# Patient Record
Sex: Female | Born: 1975 | Race: Black or African American | Hispanic: No | Marital: Married | Smoking: Never smoker
Health system: Southern US, Community
[De-identification: ages and names within clinical notes are randomized; demographics above are authoritative.]

## PROBLEM LIST (undated history)

## (undated) DIAGNOSIS — C801 Malignant (primary) neoplasm, unspecified: Secondary | ICD-10-CM

## (undated) DIAGNOSIS — D649 Anemia, unspecified: Secondary | ICD-10-CM

## (undated) DIAGNOSIS — E119 Type 2 diabetes mellitus without complications: Secondary | ICD-10-CM

## (undated) HISTORY — PX: ANKLE SURGERY: SHX546

## (undated) HISTORY — PX: PORTACATH PLACEMENT: SHX2246

## (undated) HISTORY — DX: Anemia, unspecified: D64.9

## (undated) HISTORY — DX: Type 2 diabetes mellitus without complications: E11.9

---

## 1998-06-12 ENCOUNTER — Ambulatory Visit (HOSPITAL_COMMUNITY): Admission: RE | Admit: 1998-06-12 | Discharge: 1998-06-12 | Payer: Self-pay | Admitting: Obstetrics

## 1998-07-10 ENCOUNTER — Inpatient Hospital Stay (HOSPITAL_COMMUNITY): Admission: AD | Admit: 1998-07-10 | Discharge: 1998-07-11 | Payer: Self-pay | Admitting: *Deleted

## 1998-08-13 ENCOUNTER — Emergency Department (HOSPITAL_COMMUNITY): Admission: EM | Admit: 1998-08-13 | Discharge: 1998-08-13 | Payer: Self-pay | Admitting: Internal Medicine

## 1998-08-13 ENCOUNTER — Encounter: Payer: Self-pay | Admitting: Emergency Medicine

## 1999-12-17 ENCOUNTER — Other Ambulatory Visit: Admission: RE | Admit: 1999-12-17 | Discharge: 1999-12-17 | Payer: Self-pay | Admitting: Obstetrics

## 1999-12-17 ENCOUNTER — Encounter (INDEPENDENT_AMBULATORY_CARE_PROVIDER_SITE_OTHER): Payer: Self-pay

## 2000-05-02 ENCOUNTER — Encounter (INDEPENDENT_AMBULATORY_CARE_PROVIDER_SITE_OTHER): Payer: Self-pay | Admitting: Specialist

## 2000-05-02 ENCOUNTER — Ambulatory Visit (HOSPITAL_COMMUNITY): Admission: RE | Admit: 2000-05-02 | Discharge: 2000-05-02 | Payer: Self-pay | Admitting: *Deleted

## 2001-12-04 ENCOUNTER — Encounter: Admission: RE | Admit: 2001-12-04 | Discharge: 2001-12-04 | Payer: Self-pay | Admitting: *Deleted

## 2001-12-04 ENCOUNTER — Other Ambulatory Visit: Admission: RE | Admit: 2001-12-04 | Discharge: 2001-12-04 | Payer: Self-pay | Admitting: *Deleted

## 2002-01-01 ENCOUNTER — Encounter: Admission: RE | Admit: 2002-01-01 | Discharge: 2002-01-01 | Payer: Self-pay | Admitting: *Deleted

## 2004-08-30 ENCOUNTER — Emergency Department (HOSPITAL_COMMUNITY): Admission: EM | Admit: 2004-08-30 | Discharge: 2004-08-30 | Payer: Self-pay | Admitting: Emergency Medicine

## 2004-10-29 ENCOUNTER — Encounter: Admission: RE | Admit: 2004-10-29 | Discharge: 2004-10-29 | Payer: Self-pay | Admitting: Obstetrics and Gynecology

## 2007-02-27 ENCOUNTER — Emergency Department (HOSPITAL_COMMUNITY): Admission: EM | Admit: 2007-02-27 | Discharge: 2007-02-27 | Payer: Self-pay | Admitting: Emergency Medicine

## 2010-08-24 NOTE — H&P (Signed)
[facility]  Patient:    Rita Frazier, Rita Frazier                     MRN: [id number] Attending:  Radene Knee., M.D.                         History and Physical  DATE OF BIRTH:  Apr 08, 1976  REASON FOR ADMISSION:  This 35 year old female is scheduled at [facility] on May 02, 2000, for marsupialization of a periurethral cyst, which is infected, and cystoscopy.  HISTORY OF PRESENT ILLNESS:  This 35 year old female was seen initially in March of 2000 when she was 8 months pregnant with a mass just to the left of the urethral meatus.  This was aspirated of purulent material.  She was treated with antibiotics and advised to have this excised when her pregnancy was completed.  The patient did not return until April 23, 2000.  She reports that she had not had any pain, but had been seen by her medical doctor in the clinic, who referred her here because of recurrence of the left periurethral mass.  No pain.  No urinary symptoms were noted.  No passage of blood, gravel, or stone.  We again aspirated the cyst of some 8-10 cc of purulent material.  Cultures were negative.  Gentamicin was injected into the cyst.  The patient was placed on Cipro 500 mg b.i.d.  She returns to the office on April 30, 2000, and the cyst has completely refilled itself and displaces the urethral meatus to the right.  She was advised to have marsupialization and cystoscopy and advised that complete excision of the cyst might cause some difficulty with urinary control.  She is agreeable to this procedure.  ALLERGIES:  None.  PRESENT MEDICATIONS:  Cipro 500 mg b.i.d.  Denies the use of aspirin or other medications.  PAST MEDICAL HISTORY:  She has had no surgeries and no serious illnesses, except for surgery on her left foot in 1998.  REVIEW OF SYSTEMS:  Her general health has been good.  Weight stable.  HEENT: Unremarkable.  Cardiorespiratory:  No chest pain or heart  attack.  GI:  No peptic ulcer disease.  No hepatitis.  Bowels moved normally.  Bones, Joints, and Muscles:  No arthritis.  Neuropsychiatric:  No stroke, fainting, or falling down spells.  OB/GYN:  She has had two pregnancies.  She has two children.  Last menstrual period about three to four weeks ago and have been normal.  FAMILY HISTORY:  To her knowledge, there are no stones, diabetes, heart disease, or bleeders that run in the family.  Father living and well in his early 40s.  Mother in her mid 64s living and well.  She has four sisters in good health.  Two children.  SOCIAL HISTORY:  A single female.  She does not abuse tobacco or alcohol.  PHYSICAL EXAMINATION:  A well-developed, well-nourished, 35 year old female.  VITAL SIGNS:  Blood pressure 110/80, temperature 98 degrees, pulse 76, respirations 16.  HEENT:  The ears and tympanic membranes are unremarkable.  Eyes react normally to light and accommodation.  Extraocular movements intact.  Pharynx benign. Teeth in good repair.  NECK:  No enlargement of thyroid.  No nodes palpable.  CHEST:  Clear to auscultation and auscultation.  HEART:  Normal sinus rhythm.  No murmur.  Not enlarged.  BREASTS:  Not examined.  ABDOMEN:  Moderately obese.  No masses  or tenderness.  The ultrasound revealed that her hydronephrosis of pregnancy had resolved.  She had less than 1 ounce of residual urine.  PELVIC:  A soft, nontender mass just to the left of the urethral meatus, extending backwards several centimeters.  It was fluctuant.  RECTAL:  Tone is good.  No masses are noted.  The uterus is large, anterior, and nontender.  EXTREMITIES:  No edema.  Good peripheral pulses.  NEUROLOGIC:  Grossly normal reflexes and sensation.  IMPRESSION: 1. Left periurethral cyst, infected, recurrent. 2. Hydronephrosis of pregnancy, cleared.  PLAN:  Marsupialization of periurethral cyst and cystoscopy. DD:  04/30/00 TD:  04/30/00 Job:  [id number]

## 2010-08-24 NOTE — Op Note (Signed)
[facility]  Patient:    Rita Frazier, Rita Frazier                      MRN: [id number] Proc. Date: 05/02/00 Adm. Date:  [id number] Attending:  Dalbert Mayotte                           Operative Report  PREOPERATIVE DIAGNOSIS:  Periurethral cyst, infected.  POSTOPERATIVE DIAGNOSIS:  Periurethral cyst, infected.  OPERATION PERFORMED:  Cystoscopy and marsupplination of a buried urethral cyst.  DESCRIPTION OF PROCEDURE:  This 35 year old female brought to the operating room underwent successful induction of general anesthesia and is prepped and draped in the lithotomy position. She received Cipro orally and gentamycin IV in preparation for this procedure. The patient had a large cyst just to the left of the urethra and approximately 6 or 7 cc of thick purulent material was aspirated with an 18 gauge needle and sent for cultures both aerobic and anaerobic. Then 5 cc of indigo carmine was injected into the cyst. Cystoscopy was then carried out with a 22 cystourethroscope using 70 and 12 degree lenses to check for any connection with the urethra or bladder. No connection with the cyst could be seen. The right and left ureteral orifices were normal. There was no stone, tumor, nor ulcer of the bladder. There was only mild hyperemia and erythema of the bladder and urethra. The patients bladder was then drained with a #18 Foley catheter. The cyst was then sutured in four quadrants with 2-0 Prolene traction sutures. Using the traction sutures to expose the wall of the cyst, a large window was incised in the cyst. The edges of this window were oversewn with interrupted figure-of-eight hemostatic sutures of 2-0 chromic. Good hemostasis was obtained in this manner. The inside of the cyst was examined. No connections or ramifications were noted. The cyst was then packed with iodoform gauze. The patient was sent to the recovery room in stable condition.  PLAN:  Remove  the Foley catheter if there is no bleeding and she is to go home on Cipro 500 b.i.d. Will give her Vicodin for pain and then have her return to the office in 3 or 4 days for removal of the packing. She was instructed not to engage in intercourse and she is instructed to notify us for chills, fever, vomiting, or any excess bleeding. DD:  05/02/00 TD:  05/02/00 Job: [id number]

## 2012-03-29 ENCOUNTER — Emergency Department (HOSPITAL_COMMUNITY): Payer: 59

## 2012-03-29 ENCOUNTER — Encounter (HOSPITAL_COMMUNITY): Payer: Self-pay | Admitting: Emergency Medicine

## 2012-03-29 ENCOUNTER — Emergency Department (HOSPITAL_COMMUNITY)
Admission: EM | Admit: 2012-03-29 | Discharge: 2012-03-29 | Disposition: A | Discharge status: Home / Self Care | Payer: 59 | Attending: Emergency Medicine | Admitting: Emergency Medicine

## 2012-03-29 DIAGNOSIS — R0789 Other chest pain: Secondary | ICD-10-CM | POA: Insufficient documentation

## 2012-03-29 DIAGNOSIS — M549 Dorsalgia, unspecified: Secondary | ICD-10-CM

## 2012-03-29 DIAGNOSIS — R05 Cough: Secondary | ICD-10-CM | POA: Insufficient documentation

## 2012-03-29 DIAGNOSIS — R6889 Other general symptoms and signs: Secondary | ICD-10-CM | POA: Insufficient documentation

## 2012-03-29 DIAGNOSIS — M545 Low back pain, unspecified: Secondary | ICD-10-CM | POA: Insufficient documentation

## 2012-03-29 DIAGNOSIS — R059 Cough, unspecified: Secondary | ICD-10-CM | POA: Insufficient documentation

## 2012-03-29 MED ORDER — HYDROCODONE-ACETAMINOPHEN 7.5-500 MG/15ML PO SOLN
10.0000 mL | Freq: Once | ORAL | Status: AC
  Administered 2012-03-29: 10 mL via ORAL
  Filled 2012-03-29: qty 15

## 2012-03-29 MED ORDER — HYDROCODONE-ACETAMINOPHEN 7.5-500 MG/15ML PO SOLN: 15.0000 mL | Freq: Four times a day (QID) | ORAL | Status: DC | PRN

## 2012-03-29 MED ORDER — GUAIFENESIN 100 MG/5ML PO LIQD: 100.0000 mg | ORAL | Status: DC | PRN

## 2012-03-29 NOTE — ED Notes (Signed)
Pt presenting to ed with c/o cough with greenish colored sputum pt states onset x 2 days. Pt states positive chest pain with cough. Pt states she is also having back pain since last night. Pt denies nausea and vomiting at this time. Pt denies chest pain at this time pt states only with cough

## 2012-03-29 NOTE — ED Provider Notes (Signed)
History     CSN: [id number]  Arrival date & time 03/29/12  1402   First MD Initiated Contact with Patient 03/29/12 1556      Chief Complaint  Patient presents with  . Cough  . Back Pain    (Consider location/radiation/quality/duration/timing/severity/associated sxs/prior treatment) HPI  36 year old female presents complaining of cough and back pain.  patient reports for the past 2 days she has gradual onset of persistent cough with greenish yellow sputum. She also endorsed pleuritic chest pain, runny nose, and now pain. States the pain the back is sharp, and achy, worsening with movement and cough, persistent, moderate in severity, and nonradiating. No urinary or bowel incontinence. No hematuria. She denies fever, chills, headache, ear pain, or rash. No nausea vomiting or diarrhea. Patient is a nonsmoker. She has only tried NyQuil with minimal relief. She does not have a significant history of back pain.  I will History reviewed. No pertinent past medical history.  Past Surgical History  Procedure Date  . Ankle surgery     No family history on file.  History  Substance Use Topics  . Smoking status: Never Smoker   . Smokeless tobacco: Not on file  . Alcohol Use: No    OB History    Grav Para Term Preterm Abortions TAB SAB Ect Mult Living                  Review of Systems  Constitutional: Negative for fever.  HENT: Positive for sore throat and sneezing.   Respiratory: Positive for cough. Negative for shortness of breath.   Cardiovascular: Positive for chest pain.  Gastrointestinal: Negative for abdominal pain.  Skin: Negative for rash.  Neurological: Negative for numbness.  All other systems reviewed and are negative.    Allergies  Review of patient's allergies indicates no known allergies.  Home Medications   Current Outpatient Rx  Name  Route  Sig  Dispense  Refill  . NYQUIL D COLD/FLU PO   Oral   Take 2 capsules by mouth at bedtime as needed. For cold  symptoms.           BP 129/69  Pulse 103  Temp 98.3 F (36.8 C) (Oral)  Resp 20  SpO2 99%  LMP 02/28/2012  Physical Exam  Nursing note and vitals reviewed. Constitutional: She is oriented to person, place, and time. She appears well-developed and well-nourished. No distress.       Awake, alert, nontoxic appearance  HENT:  Head: Atraumatic.  Right Ear: External ear normal.  Left Ear: External ear normal.  Mouth/Throat: Oropharynx is clear and moist.       Mild post oropharyngeal erythema without evidence of deep tissue infection.  Uvula midline  Eyes: Conjunctivae normal are normal. Right eye exhibits no discharge. Left eye exhibits no discharge.  Neck: Neck supple.  Cardiovascular: Normal rate and regular rhythm.   Pulmonary/Chest: Effort normal. No respiratory distress. She has no wheezes. She has no rales. She exhibits no tenderness.       Shallow breathing without rales or rhonchi  Abdominal: Soft. There is no tenderness. There is no rebound.  Musculoskeletal: She exhibits no edema and no tenderness.       ROM appears intact, no obvious focal weakness.  Paralumbar tenderness without midline spine tenderness, step-off or crepitus.    Patella DTR 2+  Neurological: She is alert and oriented to person, place, and time.       Mental status and motor strength appears intact  Skin: No rash noted.  Psychiatric: She has a normal mood and affect.    ED Course  Procedures (including critical care time)  Labs Reviewed - No data to display Dg Lumbar Spine Complete  03/29/2012  *RADIOLOGY REPORT*  Clinical Data: Low back pain radiating to the right hip, no injury  LUMBAR SPINE - COMPLETE 4+ VIEW  Comparison: None.  Findings: The lumbar vertebrae are in normal alignment.  There does appear to be mild degenerative disc disease at the L5-S1 level with slight loss of disc space and sclerosis.  The remainder of intervertebral disc spaces appear normal.  No compression deformity is  seen.  The facet joints are unremarkable and no pars defect is seen.  The SI joints are normally corticated.  IMPRESSION: Normal alignment with mild degenerative disc disease at L5-S1.  No acute abnormality.   Original Report Authenticated By: Dwyane Dee, M.D.      No diagnosis found.  1. Cough 2. Back pain  MDM  Pt presents with persistent cough.  She is afebrile, VSS.  Lung exam unremarkable.  Endorse low back pain.  Xray shows no acute finding, no red flags, no urinary complaints.  Plan to d/c with lortab elixir and guaifenesin.  Referral to PCP.  Low suspicion for PE.         Fayrene Helper, PA-C 03/29/12 1636

## 2012-03-29 NOTE — ED Provider Notes (Signed)
Medical screening examination/treatment/procedure(s) were performed by non-physician practitioner and as supervising physician I was immediately available for consultation/collaboration.   Flint Melter, MD 03/29/12 2329

## 2012-06-30 ENCOUNTER — Encounter: Payer: Self-pay | Admitting: *Deleted

## 2012-06-30 ENCOUNTER — Ambulatory Visit: Payer: 59 | Admitting: Family Medicine

## 2012-06-30 VITALS — BP 128/80 | HR 76 | Temp 98.3°F | Resp 12 | Ht 69.0 in | Wt 259.0 lb

## 2012-06-30 DIAGNOSIS — R638 Other symptoms and signs concerning food and fluid intake: Secondary | ICD-10-CM

## 2012-06-30 DIAGNOSIS — R3589 Other polyuria: Secondary | ICD-10-CM

## 2012-06-30 DIAGNOSIS — R5383 Other fatigue: Secondary | ICD-10-CM

## 2012-06-30 DIAGNOSIS — R5381 Other malaise: Secondary | ICD-10-CM

## 2012-06-30 DIAGNOSIS — R358 Other polyuria: Secondary | ICD-10-CM

## 2012-06-30 DIAGNOSIS — IMO0001 Reserved for inherently not codable concepts without codable children: Secondary | ICD-10-CM

## 2012-06-30 LAB — POCT URINALYSIS DIPSTICK
Glucose, UA: 500
Nitrite, UA: NEGATIVE
Protein, UA: 30
Urobilinogen, UA: 0.2
pH, UA: 5

## 2012-06-30 LAB — POCT UA - MICROSCOPIC ONLY
Casts, Ur, LPF, POC: NEGATIVE
Mucus, UA: NEGATIVE
Yeast, UA: POSITIVE

## 2012-06-30 LAB — POCT GLYCOSYLATED HEMOGLOBIN (HGB A1C): Hemoglobin A1C: 9.6

## 2012-06-30 LAB — POCT CBC
Granulocyte percent: 59 %G (ref 37–80)
MCV: 85.6 fL (ref 80–97)
MID (cbc): 0.4 (ref 0–0.9)
POC Granulocyte: 4.7 (ref 2–6.9)
POC LYMPH PERCENT: 35.6 %L (ref 10–50)
Platelet Count, POC: 264 10*3/uL (ref 142–424)
RDW, POC: 14 %

## 2012-06-30 LAB — COMPREHENSIVE METABOLIC PANEL
AST: 22 U/L (ref 0–37)
Alkaline Phosphatase: 73 U/L (ref 39–117)
BUN: 9 mg/dL (ref 6–23)
Creat: 0.92 mg/dL (ref 0.50–1.10)
Total Bilirubin: 0.5 mg/dL (ref 0.3–1.2)

## 2012-06-30 MED ORDER — BLOOD GLUCOSE METER KIT: PACK | Status: DC

## 2012-06-30 MED ORDER — METFORMIN HCL 1000 MG PO TABS: 1000.0000 mg | ORAL_TABLET | Freq: Two times a day (BID) | ORAL | Status: DC

## 2012-06-30 NOTE — Patient Instructions (Addendum)
Read the American diabetic association website  Begin metformin one half tablet twice daily for about for 5 days, then increase to one tablet twice daily with meals  Increase exercise. Walking is probably her best exercise  Decrease carbohydrate intake (breads, pulses, potatoes, rice, etc.). The foods are better for you than the process. Increase fruits and vegetables, get rid of other snacks.  Begin taking aspirin 81 mg one daily  Keep a record of your blood sugars. Try to test her sugar before breakfast and either before supper or 2 hours after supper.

## 2012-06-30 NOTE — Progress Notes (Signed)
Subjective: Patient has been having problems with excessive fatigue for the last couple of weeks. She also has been thirsty all the time. She has no history of diabetes and no family history of diabetes. She does have a history of anemia. She also has not got a lot of regular exercise. She has had some visual changes. No cardiovascular complaints. No respiratory complaints. GI unremarkable. No dysuria, but does have frequency and nocturia  Objective: Overweight lady, pleasant alert oriented, in no acute distress. TMs normal. Throat clear. Neck supple without nodes thyromegaly. Chest clear to auscultation. Heart regular without murmurs gallops or arrhythmias. Abdomen soft without mass or tenderness. No edema.   Results for orders placed in visit on 06/30/12  POCT CBC      Result Value Range   WBC 8.0  4.6 - 10.2 K/uL   Lymph, poc 2.8  0.6 - 3.4   POC LYMPH PERCENT 35.6  10 - 50 %L   MID (cbc) 0.4  0 - 0.9   POC MID % 5.4  0 - 12 %M   POC Granulocyte 4.7  2 - 6.9   Granulocyte percent 59.0  37 - 80 %G   RBC 4.47  4.04 - 5.48 M/uL   Hemoglobin 12.0 (*) 12.2 - 16.2 g/dL   HCT, POC 16.1  09.6 - 47.9 %   MCV 85.6  80 - 97 fL   MCH, POC 26.8 (*) 27 - 31.2 pg   MCHC 31.3 (*) 31.8 - 35.4 g/dL   RDW, POC 04.5     Platelet Count, POC 264  142 - 424 K/uL   MPV 10.7  0 - 99.8 fL  GLUCOSE, POCT (MANUAL RESULT ENTRY)      Result Value Range   POC Glucose 352 (*) 70 - 99 mg/dl  POCT GLYCOSYLATED HEMOGLOBIN (HGB A1C)      Result Value Range   Hemoglobin A1C 9.6    POCT UA - MICROSCOPIC ONLY      Result Value Range   WBC, Ur, HPF, POC 2-4     RBC, urine, microscopic 2-4     Bacteria, U Microscopic trace     Mucus, UA neg     Epithelial cells, urine per micros 2-5     Crystals, Ur, HPF, POC neg     Casts, Ur, LPF, POC neg     Yeast, UA positive    POCT URINALYSIS DIPSTICK      Result Value Range   Color, UA yellow     Clarity, UA clear     Glucose, UA 500     Bilirubin, UA neg     Ketones, UA 160     Spec Grav, UA 1.015     Blood, UA trace     pH, UA 5.0     Protein, UA 30     Urobilinogen, UA 0.2     Nitrite, UA neg     Leukocytes, UA Negative     Assessment: Type 2 diabetes mellitus, new diagnosis, uncontrolled Obesity Fatigue History of anemia  Plan: Begin metformin. Return in 3 or 4 weeks. See instructions. Advised her to try to find a diabetic class at [facility].

## 2012-07-01 ENCOUNTER — Encounter: Payer: Self-pay | Admitting: Family Medicine

## 2012-07-02 ENCOUNTER — Other Ambulatory Visit: Payer: Self-pay | Admitting: Obstetrics

## 2012-07-02 ENCOUNTER — Ambulatory Visit (INDEPENDENT_AMBULATORY_CARE_PROVIDER_SITE_OTHER): Payer: Self-pay | Admitting: Obstetrics

## 2012-07-02 ENCOUNTER — Encounter: Payer: Self-pay | Admitting: Obstetrics

## 2012-07-02 VITALS — BP 138/89 | HR 110 | Temp 97.5°F | Ht 70.0 in | Wt 207.0 lb

## 2012-07-02 DIAGNOSIS — Z124 Encounter for screening for malignant neoplasm of cervix: Secondary | ICD-10-CM | POA: Insufficient documentation

## 2012-07-02 DIAGNOSIS — N760 Acute vaginitis: Secondary | ICD-10-CM | POA: Insufficient documentation

## 2012-07-02 DIAGNOSIS — Z7189 Other specified counseling: Secondary | ICD-10-CM | POA: Insufficient documentation

## 2012-07-02 DIAGNOSIS — Z01419 Encounter for gynecological examination (general) (routine) without abnormal findings: Secondary | ICD-10-CM

## 2012-07-02 DIAGNOSIS — N920 Excessive and frequent menstruation with regular cycle: Secondary | ICD-10-CM | POA: Insufficient documentation

## 2012-07-02 MED ORDER — MEDROXYPROGESTERONE ACETATE 10 MG PO TABS: 10.0000 mg | ORAL_TABLET | Freq: Every day | ORAL | Status: DC

## 2012-07-02 MED ORDER — FLUCONAZOLE 150 MG PO TABS: 150.0000 mg | ORAL_TABLET | Freq: Once | ORAL | Status: DC

## 2012-07-02 MED ORDER — METRONIDAZOLE 500 MG PO TABS: 500.0000 mg | ORAL_TABLET | Freq: Two times a day (BID) | ORAL | Status: DC

## 2012-07-02 NOTE — Progress Notes (Signed)
  Subjective:     Rita Frazier is a 37 y.o. female and is here for a comprehensive physical exam. The patient reports problems - Bready vaginal discharge and heavy and long periods..  History   Social History  . Marital Status: Married    Spouse Name: [name]    Number of Children: 2  . Years of Education: N/A   Occupational History  . Customer Service     Social History Main Topics  . Smoking status: Never Smoker   . Smokeless tobacco: Never Used  . Alcohol Use: No  . Drug Use: No  . Sexually Active: Yes   Other Topics Concern  . Not on file   Social History Narrative  . No narrative on file   Health Maintenance  Topic Date Due  . Influenza Vaccine  12/07/1977  . Tetanus/tdap  12/01/1996  . Pap Smear  05/12/2014    The following portions of the patient's history were reviewed and updated as appropriate: allergies, current medications, past family history, past medical history, past social history, past surgical history and problem list.  Review of Systems A comprehensive review of systems was negative except for: Genitourinary: positive for abnormal menstrual periods   Objective:    General appearance: alert and no distress Breasts: normal appearance, no masses or tenderness Abdomen: soft, non-tender; bowel sounds normal; no masses,  no organomegaly Pelvic: cervix normal in appearance, external genitalia normal, no adnexal masses or tenderness, no cervical motion tenderness, rectovaginal septum normal, uterus normal size, shape, and consistency and vagina normal without discharge Extremities: extremities normal, atraumatic, no cyanosis or edema    Assessment:    Healthy female exam. Menorrhagia.  Considering Ablation/IUD.      Plan:     Transport planner given.  Patient will call with decision. See After Visit Summary for Counseling Recommendations

## 2012-07-02 NOTE — Patient Instructions (Addendum)
Transport planner given.  All questions regarding options for treatment answered.

## 2012-07-03 LAB — PAP IG W/ RFLX HPV ASCU

## 2012-07-06 ENCOUNTER — Telehealth: Payer: Self-pay

## 2012-07-06 NOTE — Telephone Encounter (Signed)
Left message for her, which unit will her insurance cover?

## 2012-07-06 NOTE — Telephone Encounter (Signed)
PATIENT IS CALLING REQUESTING A DIABETES TEST KIT. WAS TOLD THAT ONE WOULD BE ORDERED FOR HER BUT HAS NOT HEARD ANYTHING YET. WANTS TO KNOW IF SHE SHOULD GET ONE ON HER OWN. LAST SAW DR. HOPPER.   BEST CALL BACK: [phone]

## 2012-07-07 ENCOUNTER — Other Ambulatory Visit: Payer: Self-pay | Admitting: *Deleted

## 2012-07-07 NOTE — Telephone Encounter (Signed)
Advised pt to call pharmacy to see if they received rx for kit.  If not she can call us back.  Pt not sure what her ins covers.

## 2012-07-07 NOTE — Progress Notes (Signed)
Quick Note:  Pt aware of results and expressed understanding. ______

## 2012-07-07 NOTE — Telephone Encounter (Signed)
Called in glucose testing kit to pharmacy.

## 2012-07-07 NOTE — Progress Notes (Signed)
Quick Note:  Left message with female party for pt to return call. ______

## 2012-07-08 DIAGNOSIS — Z0271 Encounter for disability determination: Secondary | ICD-10-CM

## 2012-07-14 ENCOUNTER — Ambulatory Visit: Payer: Self-pay | Admitting: Obstetrics

## 2012-07-16 ENCOUNTER — Encounter: Payer: Self-pay | Admitting: Family Medicine

## 2012-07-25 ENCOUNTER — Ambulatory Visit: Payer: 59

## 2012-07-28 ENCOUNTER — Encounter: Payer: Self-pay | Admitting: Family Medicine

## 2012-07-30 ENCOUNTER — Other Ambulatory Visit: Payer: Self-pay | Admitting: Physician Assistant

## 2012-07-30 ENCOUNTER — Other Ambulatory Visit: Payer: Self-pay | Admitting: Family Medicine

## 2012-07-30 MED ORDER — SITAGLIPTIN PHOSPHATE 100 MG PO TABS: 100.0000 mg | ORAL_TABLET | Freq: Every day | ORAL | Status: DC

## 2012-08-07 ENCOUNTER — Encounter: Payer: Self-pay | Admitting: Family Medicine

## 2012-11-06 NOTE — Progress Notes (Signed)
This encounter was created in error - please disregard.

## 2012-11-18 ENCOUNTER — Encounter: Payer: Self-pay | Admitting: Family Medicine

## 2013-02-11 ENCOUNTER — Other Ambulatory Visit: Payer: Self-pay

## 2013-07-05 ENCOUNTER — Ambulatory Visit: Payer: 59 | Admitting: Obstetrics

## 2014-01-06 HISTORY — PX: MASTECTOMY: SHX3

## 2014-02-20 ENCOUNTER — Encounter (HOSPITAL_COMMUNITY): Payer: Self-pay | Admitting: *Deleted

## 2014-02-20 ENCOUNTER — Emergency Department (HOSPITAL_COMMUNITY): Payer: No Typology Code available for payment source

## 2014-02-20 ENCOUNTER — Emergency Department (HOSPITAL_COMMUNITY)
Admission: EM | Admit: 2014-02-20 | Discharge: 2014-02-21 | Disposition: A | Discharge status: Home / Self Care | Payer: No Typology Code available for payment source | Attending: Emergency Medicine | Admitting: Emergency Medicine

## 2014-02-20 DIAGNOSIS — Z3202 Encounter for pregnancy test, result negative: Secondary | ICD-10-CM | POA: Diagnosis not present

## 2014-02-20 DIAGNOSIS — R000 Tachycardia, unspecified: Secondary | ICD-10-CM | POA: Insufficient documentation

## 2014-02-20 DIAGNOSIS — Z79899 Other long term (current) drug therapy: Secondary | ICD-10-CM | POA: Insufficient documentation

## 2014-02-20 DIAGNOSIS — R0602 Shortness of breath: Secondary | ICD-10-CM | POA: Diagnosis not present

## 2014-02-20 DIAGNOSIS — R5383 Other fatigue: Secondary | ICD-10-CM | POA: Diagnosis present

## 2014-02-20 DIAGNOSIS — Z862 Personal history of diseases of the blood and blood-forming organs and certain disorders involving the immune mechanism: Secondary | ICD-10-CM | POA: Insufficient documentation

## 2014-02-20 DIAGNOSIS — Z7952 Long term (current) use of systemic steroids: Secondary | ICD-10-CM | POA: Diagnosis not present

## 2014-02-20 DIAGNOSIS — E119 Type 2 diabetes mellitus without complications: Secondary | ICD-10-CM | POA: Diagnosis not present

## 2014-02-20 DIAGNOSIS — Z792 Long term (current) use of antibiotics: Secondary | ICD-10-CM | POA: Diagnosis not present

## 2014-02-20 DIAGNOSIS — Z853 Personal history of malignant neoplasm of breast: Secondary | ICD-10-CM | POA: Diagnosis not present

## 2014-02-20 HISTORY — DX: Malignant (primary) neoplasm, unspecified: C80.1

## 2014-02-20 LAB — BASIC METABOLIC PANEL
Anion gap: 10 (ref 5–15)
BUN: 14 mg/dL (ref 6–23)
CALCIUM: 9.1 mg/dL (ref 8.4–10.5)
CHLORIDE: 104 meq/L (ref 96–112)
CO2: 23 meq/L (ref 19–32)
CREATININE: 0.74 mg/dL (ref 0.50–1.10)
GFR calc Af Amer: >90 mL/min (ref >90)
GFR calc non Af Amer: >90 mL/min (ref >90)
Glucose, Bld: 143 mg/dL — ABNORMAL HIGH (ref 70–99)
Potassium: 3.4 mEq/L — ABNORMAL LOW (ref 3.7–5.3)
Sodium: 137 mEq/L (ref 137–147)

## 2014-02-20 LAB — URINALYSIS, ROUTINE W REFLEX MICROSCOPIC
BILIRUBIN URINE: NEGATIVE
Glucose, UA: NEGATIVE mg/dL
HGB URINE DIPSTICK: NEGATIVE
Ketones, ur: NEGATIVE mg/dL
Leukocytes, UA: NEGATIVE
Nitrite: NEGATIVE
PH: 7.5 (ref 5.0–8.0)
Protein, ur: NEGATIVE mg/dL
SPECIFIC GRAVITY, URINE: 1.025 (ref 1.005–1.030)
Urobilinogen, UA: 1 mg/dL (ref 0.0–1.0)

## 2014-02-20 LAB — POC URINE PREG, ED: Preg Test, Ur: NEGATIVE

## 2014-02-20 MED ORDER — SODIUM CHLORIDE 0.9 % IV BOLUS (SEPSIS)
1000.0000 mL | Freq: Once | INTRAVENOUS | Status: AC
  Administered 2014-02-20: 1000 mL via INTRAVENOUS

## 2014-02-20 NOTE — ED Notes (Signed)
Pt states that she has not been feeling well al day; pt states that she has felt fatigued and has no energy; pt states that she had a fever 100.6 at home PTA; pt afebrile in triage; pt denies taking any medications for fever; pt c/o mild shortness of breath, mild cough and headache

## 2014-02-20 NOTE — ED Provider Notes (Signed)
CSN: [id number]     Arrival date & time 02/20/14  2147 History   First MD Initiated Contact with Patient 02/20/14 2216     Chief Complaint  Patient presents with  . Fatigue     (Consider location/radiation/quality/duration/timing/severity/associated sxs/prior Treatment) HPI Comments: Patient is a 38 y/o female with active breast cancer presenting to the Emergency Department with complaint of fatigue. She states she had chemotherapy on Monday (her second treatment) and had been feeling fatigued since then, worsening this morning. She complains of a headache, fever (100.6 measured at home), mild shortness of breath and nonproductive cough. She denies n/v/d, abdominal pain, or urinary symptoms. No chest pain. She receives cancer tx at [facility]. No medications taken prior to arrival.  The history is provided by the patient.    Past Medical History  Diagnosis Date  . Anemia   . Diabetes mellitus, type 2   . Cancer     breast   Past Surgical History  Procedure Laterality Date  . Ankle surgery    . Fracture surgery    . Breast lumpectomy    . Portacath placement     Family History  Problem Relation Age of Onset  . Cancer Mother   . Cancer Sister    History  Substance Use Topics  . Smoking status: Never Smoker   . Smokeless tobacco: Never Used  . Alcohol Use: No   OB History    No data available     Review of Systems  10 Systems reviewed and are negative for acute change except as noted in the HPI.  Allergies  Review of patient's allergies indicates no known allergies.  Home Medications   Prior to Admission medications   Medication Sig Start Date End Date Taking? Authorizing Provider  dexamethasone (DECADRON) 4 MG tablet Take 8 mg by mouth daily.   Yes Historical Provider, MD  ibuprofen (ADVIL,MOTRIN) 200 MG tablet Take 400 mg by mouth every 6 (six) hours as needed for moderate pain.   Yes Historical Provider, MD  zolpidem (AMBIEN) 5 MG tablet Take 5 mg by  mouth at bedtime as needed for sleep.   Yes Historical Provider, MD  Blood Glucose Monitoring Suppl (BLOOD GLUCOSE METER) kit Use as instructed 06/30/12   Posey Boyer, MD  fluconazole (DIFLUCAN) 150 MG tablet Take 1 tablet (150 mg total) by mouth once. 07/02/12   Shelly Bombard, MD  medroxyPROGESTERone (PROVERA) 10 MG tablet Take 1 tablet (10 mg total) by mouth daily. 07/02/12   Shelly Bombard, MD  metFORMIN (GLUCOPHAGE) 500 MG tablet Take 500 mg by mouth 2 (two) times daily with a meal.    Historical Provider, MD  metroNIDAZOLE (FLAGYL) 500 MG tablet Take 1 tablet (500 mg total) by mouth 2 (two) times daily. 07/02/12   Shelly Bombard, MD  ONE TOUCH ULTRA TEST test strip TEST ONCE DAILY 07/30/12   Eleanore E Egan, PA-C   BP 131/88 mmHg  Pulse 106  Temp(Src) 98.5 F (36.9 C) (Oral)  Resp 18  Ht 5' 10"  (1.778 m)  Wt 260 lb (117.935 kg)  BMI 37.31 kg/m2  SpO2 99%  LMP 02/06/2014 Physical Exam  Constitutional: She is oriented to person, place, and time. She appears well-developed and well-nourished. No distress.  HENT:  Head: Normocephalic and atraumatic.  Mouth/Throat: Oropharynx is clear and moist.  Eyes: Conjunctivae are normal.  Neck: Normal range of motion. Neck supple.  Cardiovascular: Regular rhythm and normal heart sounds.   Tachycardic.  Pulmonary/Chest: Effort normal and breath sounds normal. No respiratory distress.  Abdominal: Soft. Bowel sounds are normal. There is no tenderness.  Musculoskeletal: Normal range of motion. She exhibits no edema.  Neurological: She is alert and oriented to person, place, and time.  Skin: Skin is warm and dry. She is not diaphoretic.  Psychiatric: She has a normal mood and affect. Her behavior is normal.  Nursing note and vitals reviewed.   ED Course  Procedures (including critical care time) Labs Review Labs Reviewed  CBC WITH DIFFERENTIAL - Abnormal; Notable for the following:    WBC 26.0 (*)    RBC 3.13 (*)    Hemoglobin 9.0 (*)     HCT 27.4 (*)    Neutrophils Relative % 41 (*)    Band Neutrophils 18 (*)    Neutro Abs 17.7 (*)    Lymphs Abs 6.0 (*)    Monocytes Absolute 2.3 (*)    All other components within normal limits  BASIC METABOLIC PANEL - Abnormal; Notable for the following:    Potassium 3.4 (*)    Glucose, Bld 143 (*)    All other components within normal limits  CULTURE, BLOOD (ROUTINE X 2)  CULTURE, BLOOD (ROUTINE X 2)  URINALYSIS, ROUTINE W REFLEX MICROSCOPIC  INFLUENZA PANEL BY PCR (TYPE A & B, H1N1)  POC URINE PREG, ED    Imaging Review Dg Chest 2 View  02/20/2014   CLINICAL DATA:  Patient not feeling well with fatigue and low energy. Fever. Shortness of breath. Cough and headache.  EXAM: CHEST  2 VIEW  COMPARISON:  08/30/2004  FINDINGS: Shallow inspiration with linear atelectasis in the lung bases. Heart size and pulmonary vascularity are normal. No focal airspace disease or consolidation. Interval placement of the power port type central venous catheter with tip over the upper SVC. No pneumothorax. Surgical clips in the left axilla.  IMPRESSION: Shallow inspiration with atelectasis in the lung bases.   Electronically Signed   By: Lucienne Capers M.D.   On: 02/20/2014 23:25     EKG Interpretation None      MDM   Final diagnoses:  SOB (shortness of breath)  Tachycardia   Patient with recent diagnosis of breast cancer undergoing chemotherapy as stated above, presenting with URI symptoms. Afebrile on arrival, tachycardic, no apparent distress. She received Neulasta after her chemotherapy treatment which most likely explains her leukocytosis of 26,000. Lungs clear. Given the reported fever at home along with tachycardia, blood cultures collected and pending. I spoke with Dr. Rhona Raider, on call for pt's oncologist Dr. Wendee Beavers who states that the patient is not ill appearing she does not require admission. Patient given a dose of IV Rocephin in the emergency department. Admission for  observation discussed with patient who does not want to stay in the hospital. Patient evaluated by on-call hospitalist, Dr. Alcario Drought, who also evaluated patient and states she would be safe to be discharged home. Patient also evaluated by my attending Dr. Regenia Skeeter. We do not feel admission is required at this time. Patient can be discharged home. She will follow-up with her PCP tomorrow. Return precautions given. Patient states understanding of treatment care plan and is agreeable.  Carman Ching, PA-C 02/22/14 [place], MD 03/02/14 [id number]

## 2014-02-21 LAB — CBC WITH DIFFERENTIAL/PLATELET
Band Neutrophils: 18 % — ABNORMAL HIGH (ref 0–10)
Basophils Absolute: 0 10*3/uL (ref 0.0–0.1)
Basophils Relative: 0 % (ref 0–1)
EOS ABS: 0 10*3/uL (ref 0.0–0.7)
Eosinophils Relative: 0 % (ref 0–5)
HCT: 27.4 % — ABNORMAL LOW (ref 36.0–46.0)
Hemoglobin: 9 g/dL — ABNORMAL LOW (ref 12.0–15.0)
LYMPHS ABS: 6 10*3/uL — AB (ref 0.7–4.0)
Lymphocytes Relative: 23 % (ref 12–46)
MCH: 28.8 pg (ref 26.0–34.0)
MCHC: 32.8 g/dL (ref 30.0–36.0)
MCV: 87.5 fL (ref 78.0–100.0)
METAMYELOCYTES PCT: 6 %
MONO ABS: 2.3 10*3/uL — AB (ref 0.1–1.0)
MONOS PCT: 9 % (ref 3–12)
Myelocytes: 3 %
NEUTROS PCT: 41 % — AB (ref 43–77)
Neutro Abs: 17.7 10*3/uL — ABNORMAL HIGH (ref 1.7–7.7)
PLATELETS: 269 10*3/uL (ref 150–400)
RBC: 3.13 MIL/uL — AB (ref 3.87–5.11)
RDW: 15.3 % (ref 11.5–15.5)
WBC: 26 10*3/uL — AB (ref 4.0–10.5)

## 2014-02-21 MED ORDER — DEXTROSE 5 % IV SOLN
1.0000 g | Freq: Once | INTRAVENOUS | Status: AC
  Administered 2014-02-21: 1 g via INTRAVENOUS
  Filled 2014-02-21: qty 10

## 2014-02-21 NOTE — Discharge Instructions (Signed)
Nonspecific Tachycardia Tachycardia is a faster than normal heartbeat (more than 100 beats per minute). In adults, the heart normally beats between 60 and 100 times a minute. A fast heartbeat may be a normal response to exercise or stress. It does not necessarily mean that something is wrong. However, sometimes when your heart beats too fast it may not be able to pump enough blood to the rest of your body. This can result in chest pain, shortness of breath, dizziness, and even fainting. Nonspecific tachycardia means that the specific cause or pattern of your tachycardia is unknown. CAUSES  Tachycardia may be harmless or it may be due to a more serious underlying cause. Possible causes of tachycardia include:  Exercise or exertion.  Fever.  Pain or injury.  Infection.  Loss of body fluids (dehydration).  Overactive thyroid.  Lack of red blood cells (anemia).  Anxiety and stress.  Alcohol.  Caffeine.  Tobacco products.  Diet pills.  Illegal drugs.  Heart disease. SYMPTOMS  Rapid or irregular heartbeat (palpitations).  Suddenly feeling your heart beating (cardiac awareness).  Dizziness.  Tiredness (fatigue).  Shortness of breath.  Chest pain.  Nausea.  Fainting. DIAGNOSIS  Your caregiver will perform a physical exam and take your medical history. In some cases, a heart specialist (cardiologist) may be consulted. Your caregiver may also order:  Blood tests.  Electrocardiography. This test records the electrical activity of your heart.  A heart monitoring test. TREATMENT  Treatment will depend on the likely cause of your tachycardia. The goal is to treat the underlying cause of your tachycardia. Treatment methods may include:  Replacement of fluids or blood through an intravenous (IV) tube for moderate to severe dehydration or anemia.  New medicines or changes in your current medicines.  Diet and lifestyle changes.  Treatment for certain  infections.  Stress relief or relaxation methods. HOME CARE INSTRUCTIONS   Rest.  Drink enough fluids to keep your urine clear or pale yellow.  Do not smoke.  Avoid:  Caffeine.  Tobacco.  Alcohol.  Chocolate.  Stimulants such as over-the-counter diet pills or pills that help you stay awake.  Situations that cause anxiety or stress.  Illegal drugs such as marijuana, phencyclidine (PCP), and cocaine.  Only take medicine as directed by your caregiver.  Keep all follow-up appointments as directed by your caregiver. SEEK IMMEDIATE MEDICAL CARE IF:   You have pain in your chest, upper arms, jaw, or neck.  You become weak, dizzy, or feel faint.  You have palpitations that will not go away.  You vomit, have diarrhea, or pass blood in your stool.  Your skin is cool, pale, and wet.  You have a fever that will not go away with rest, fluids, and medicine. MAKE SURE YOU:   Understand these instructions.  Will watch your condition.  Will get help right away if you are not doing well or get worse. Document Released: 05/02/2004 Document Revised: 06/17/2011 Document Reviewed: 03/05/2011 [facility] Patient Information 2015 [place], [place]. This information is not intended to replace advice given to you by your health care provider. Make sure you discuss any questions you have with your health care provider.  Shortness of Breath Shortness of breath means you have trouble breathing. It could also mean that you have a medical problem. You should get immediate medical care for shortness of breath. CAUSES   Not enough oxygen in the air such as with high altitudes or a smoke-filled room.  Certain lung diseases, infections, or problems.  Heart disease or conditions, such as angina or heart failure.  Low red blood cells (anemia).  Poor physical fitness, which can cause shortness of breath when you exercise.  Chest or back injuries or stiffness.  Being  overweight.  Smoking.  Anxiety, which can make you feel like you are not getting enough air. DIAGNOSIS  Serious medical problems can often be found during your physical exam. Tests may also be done to determine why you are having shortness of breath. Tests may include:  Chest X-rays.  Lung function tests.  Blood tests.  An electrocardiogram (ECG).  An ambulatory electrocardiogram. An ambulatory ECG records your heartbeat patterns over a 24-hour period.  Exercise testing.  A transthoracic echocardiogram (TTE). During echocardiography, sound waves are used to evaluate how blood flows through your heart.  A transesophageal echocardiogram (TEE).  Imaging scans. Your health care provider may not be able to find a cause for your shortness of breath after your exam. In this case, it is important to have a follow-up exam with your health care provider as directed.  TREATMENT  Treatment for shortness of breath depends on the cause of your symptoms and can vary greatly. HOME CARE INSTRUCTIONS   Do not smoke. Smoking is a common cause of shortness of breath. If you smoke, ask for help to quit.  Avoid being around chemicals or things that may bother your breathing, such as paint fumes and dust.  Rest as needed. Slowly resume your usual activities.  If medicines were prescribed, take them as directed for the full length of time directed. This includes oxygen and any inhaled medicines.  Keep all follow-up appointments as directed by your health care provider. SEEK MEDICAL CARE IF:   Your condition does not improve in the time expected.  You have a hard time doing your normal activities even with rest.  You have any new symptoms. SEEK IMMEDIATE MEDICAL CARE IF:   Your shortness of breath gets worse.  You feel light-headed, faint, or develop a cough not controlled with medicines.  You start coughing up blood.  You have pain with breathing.  You have chest pain or pain in your  arms, shoulders, or abdomen.  You have a fever.  You are unable to walk up stairs or exercise the way you normally do. MAKE SURE YOU:  Understand these instructions.  Will watch your condition.  Will get help right away if you are not doing well or get worse. Document Released: 12/18/2000 Document Revised: 03/30/2013 Document Reviewed: 06/10/2011 [facility] Patient Information 2015 [place], [place]. This information is not intended to replace advice given to you by your health care provider. Make sure you discuss any questions you have with your health care provider.

## 2014-02-27 LAB — CULTURE, BLOOD (ROUTINE X 2)
CULTURE: NO GROWTH
Culture: NO GROWTH

## 2016-09-08 ENCOUNTER — Emergency Department (HOSPITAL_COMMUNITY): Payer: 59

## 2016-09-08 ENCOUNTER — Encounter (HOSPITAL_COMMUNITY): Payer: Self-pay

## 2016-09-08 ENCOUNTER — Emergency Department (HOSPITAL_COMMUNITY)
Admission: EM | Admit: 2016-09-08 | Discharge: 2016-09-08 | Disposition: A | Discharge status: Home / Self Care | Payer: 59 | Attending: Emergency Medicine | Admitting: Emergency Medicine

## 2016-09-08 DIAGNOSIS — K7689 Other specified diseases of liver: Secondary | ICD-10-CM | POA: Insufficient documentation

## 2016-09-08 DIAGNOSIS — C799 Secondary malignant neoplasm of unspecified site: Secondary | ICD-10-CM

## 2016-09-08 DIAGNOSIS — R1011 Right upper quadrant pain: Secondary | ICD-10-CM | POA: Insufficient documentation

## 2016-09-08 DIAGNOSIS — E119 Type 2 diabetes mellitus without complications: Secondary | ICD-10-CM | POA: Diagnosis not present

## 2016-09-08 DIAGNOSIS — R05 Cough: Secondary | ICD-10-CM | POA: Diagnosis not present

## 2016-09-08 DIAGNOSIS — Z7984 Long term (current) use of oral hypoglycemic drugs: Secondary | ICD-10-CM | POA: Diagnosis not present

## 2016-09-08 DIAGNOSIS — R1010 Upper abdominal pain, unspecified: Secondary | ICD-10-CM | POA: Diagnosis present

## 2016-09-08 DIAGNOSIS — Z7982 Long term (current) use of aspirin: Secondary | ICD-10-CM | POA: Insufficient documentation

## 2016-09-08 LAB — CBC WITH DIFFERENTIAL/PLATELET
BASOS PCT: 0 %
Basophils Absolute: 0 10*3/uL (ref 0.0–0.1)
EOS PCT: 0 %
Eosinophils Absolute: 0 10*3/uL (ref 0.0–0.7)
HEMATOCRIT: 36.4 % (ref 36.0–46.0)
Hemoglobin: 12.3 g/dL (ref 12.0–15.0)
Lymphocytes Relative: 29 %
Lymphs Abs: 2.1 10*3/uL (ref 0.7–4.0)
MCH: 30.4 pg (ref 26.0–34.0)
MCHC: 33.8 g/dL (ref 30.0–36.0)
MCV: 89.9 fL (ref 78.0–100.0)
MONOS PCT: 7 %
Monocytes Absolute: 0.5 10*3/uL (ref 0.1–1.0)
Neutro Abs: 4.5 10*3/uL (ref 1.7–7.7)
Neutrophils Relative %: 64 %
Platelets: 252 10*3/uL (ref 150–400)
RBC: 4.05 MIL/uL (ref 3.87–5.11)
RDW: 12.7 % (ref 11.5–15.5)
WBC: 7.1 10*3/uL (ref 4.0–10.5)

## 2016-09-08 LAB — COMPREHENSIVE METABOLIC PANEL
ALBUMIN: 3.7 g/dL (ref 3.5–5.0)
ALK PHOS: 118 U/L (ref 38–126)
ALT: 85 U/L — ABNORMAL HIGH (ref 14–54)
AST: 80 U/L — AB (ref 15–41)
Anion gap: 9 (ref 5–15)
BILIRUBIN TOTAL: 1 mg/dL (ref 0.3–1.2)
BUN: 13 mg/dL (ref 6–20)
CALCIUM: 9.4 mg/dL (ref 8.9–10.3)
CO2: 22 mmol/L (ref 22–32)
Chloride: 107 mmol/L (ref 101–111)
Creatinine, Ser: 0.61 mg/dL (ref 0.44–1.00)
GFR calc Af Amer: >60 mL/min (ref >60)
GLUCOSE: 135 mg/dL — AB (ref 65–99)
Potassium: 3.9 mmol/L (ref 3.5–5.1)
Sodium: 138 mmol/L (ref 135–145)
TOTAL PROTEIN: 7.9 g/dL (ref 6.5–8.1)

## 2016-09-08 LAB — PREGNANCY, URINE: Preg Test, Ur: NEGATIVE

## 2016-09-08 LAB — URINALYSIS, ROUTINE W REFLEX MICROSCOPIC
Bilirubin Urine: NEGATIVE
GLUCOSE, UA: NEGATIVE mg/dL
Ketones, ur: NEGATIVE mg/dL
Leukocytes, UA: NEGATIVE
NITRITE: NEGATIVE
PH: 6 (ref 5.0–8.0)
PROTEIN: NEGATIVE mg/dL
RBC / HPF: NONE SEEN RBC/hpf (ref 0–5)
Specific Gravity, Urine: 1.009 (ref 1.005–1.030)

## 2016-09-08 LAB — D-DIMER, QUANTITATIVE: D-Dimer, Quant: 7.7 ug/mL-FEU — ABNORMAL HIGH (ref 0.00–0.50)

## 2016-09-08 LAB — LIPASE, BLOOD: Lipase: 25 U/L (ref 11–51)

## 2016-09-08 MED ORDER — IOPAMIDOL (ISOVUE-370) INJECTION 76%
INTRAVENOUS | Status: AC
  Filled 2016-09-08: qty 100

## 2016-09-08 MED ORDER — IOPAMIDOL (ISOVUE-370) INJECTION 76%
100.0000 mL | Freq: Once | INTRAVENOUS | Status: AC | PRN
  Administered 2016-09-08: 100 mL via INTRAVENOUS

## 2016-09-08 NOTE — ED Notes (Signed)
Patient informed of need for urine

## 2016-09-08 NOTE — ED Notes (Signed)
Patient is alert and oriented x3.  She was given DC instructions and follow up visit instructions.  Patient gave verbal understanding. She was DC ambulatory under her own power to home.  V/S stable.  He was not showing any signs of distress on DC 

## 2016-09-08 NOTE — ED Provider Notes (Signed)
[facility] DEPT Provider Note   CSN: [id number] Arrival date & time: 09/08/16  0800     History   Chief Complaint Chief Complaint  Patient presents with  . Flank Pain  . Arm Pain  . Cough    HPI Rita Frazier is a 41 y.o. female.  Patient's a 41 year old female with a history of type 2 diabetes and prior history of breast cancer who presents with right-sided abdominal pain. She states it started on Thursday. It hurts when she touches it or when she coughs. She denies any shortness of breath. No nausea or vomiting. It doesn't seem to be related to eating. She denies any urinary symptoms. No vaginal bleeding or discharge. She does have an ongoing dry cough. She denies any leg pain or swelling. No known fevers. She denies any prior abdominal surgeries. She was seen in urgent care 2 days ago and she says that they took her blood in urine but she doesn't know any results.      Past Medical History:  Diagnosis Date  . Anemia   . Cancer ([place])    breast  . Diabetes mellitus, type 2 [facility])     Patient Active Problem List   Diagnosis Date Noted  . Routine gynecological examination 07/02/2012  . Excessive or frequent menstruation 07/02/2012  . Vaginitis and vulvovaginitis, unspecified 07/02/2012  . Excessive or frequent menstruation 07/02/2012  . Screening for malignant neoplasm of the cervix 07/02/2012    Past Surgical History:  Procedure Laterality Date  . ANKLE SURGERY    . BREAST LUMPECTOMY    . FRACTURE SURGERY    . PORTACATH PLACEMENT      OB History    No data available       Home Medications    Prior to Admission medications   Medication Sig Start Date End Date Taking? Authorizing Provider  acetaminophen (TYLENOL) 325 MG tablet Take 650 mg by mouth every 6 (six) hours as needed.   Yes [provider]  BIOTIN PO Take 1 tablet by mouth daily.   Yes [provider]  metFORMIN (GLUCOPHAGE-XR) 500 MG 24 hr tablet Take 500 mg by mouth daily.    Yes [provider]    Family History Family History  Problem Relation Age of Onset  . Cancer Mother   . Cancer Sister     Social History Social History  Substance Use Topics  . Smoking status: Never Smoker  . Smokeless tobacco: Never Used  . Alcohol use No     Allergies   Patient has no known allergies.   Review of Systems Review of Systems  Constitutional: Negative for chills, diaphoresis, fatigue and fever.  HENT: Negative for congestion, rhinorrhea and sneezing.   Eyes: Negative.   Respiratory: Positive for cough. Negative for chest tightness and shortness of breath.   Cardiovascular: Negative for chest pain and leg swelling.  Gastrointestinal: Positive for abdominal pain. Negative for blood in stool, diarrhea, nausea and vomiting.  Genitourinary: Negative for difficulty urinating, flank pain, frequency and hematuria.  Musculoskeletal: Negative for arthralgias and back pain.  Skin: Negative for rash.  Neurological: Negative for dizziness, speech difficulty, weakness, numbness and headaches.     Physical Exam Updated Vital Signs BP 133/90 (BP Location: Left Arm)   Pulse 92   Temp 98.1 F (36.7 C) (Oral)   Resp 16   LMP 09/03/2016   SpO2 99%   Physical Exam  Constitutional: She is oriented to person, place, and time. She appears  well-developed and well-nourished.  HENT:  Head: Normocephalic and atraumatic.  Eyes: Pupils are equal, round, and reactive to light.  Neck: Normal range of motion. Neck supple.  Cardiovascular: Normal rate, regular rhythm and normal heart sounds.   Pulmonary/Chest: Effort normal and breath sounds normal. No respiratory distress. She has no wheezes. She has no rales. She exhibits no tenderness.  Abdominal: Soft. Bowel sounds are normal. There is no tenderness. There is no rebound and no guarding.  Musculoskeletal: Normal range of motion. She exhibits no edema.  No edema or calf tenderness  Lymphadenopathy:    She has no  cervical adenopathy.  Neurological: She is alert and oriented to person, place, and time.  Skin: Skin is warm and dry. No rash noted.  Psychiatric: She has a normal mood and affect.     ED Treatments / Results  Labs (all labs ordered are listed, but only abnormal results are displayed) Labs Reviewed  URINALYSIS, ROUTINE W REFLEX MICROSCOPIC - Abnormal; Notable for the following:       Result Value   Hgb urine dipstick MODERATE (*)    Bacteria, UA RARE (*)    Squamous Epithelial / LPF 0-5 (*)    All other components within normal limits  COMPREHENSIVE METABOLIC PANEL - Abnormal; Notable for the following:    Glucose, Bld 135 (*)    AST 80 (*)    ALT 85 (*)    All other components within normal limits  D-DIMER, QUANTITATIVE (NOT AT [facility]) - Abnormal; Notable for the following:    D-Dimer, Quant 7.70 (*)    All other components within normal limits  PREGNANCY, URINE  LIPASE, BLOOD  CBC WITH DIFFERENTIAL/PLATELET    EKG  EKG Interpretation None       Radiology Dg Chest 2 View  Result Date: 09/08/2016 CLINICAL DATA:  Cough. EXAM: CHEST  2 VIEW COMPARISON:  02/20/2014 and prior exams FINDINGS: The cardiomediastinal silhouette is unremarkable. Mild peribronchial thickening is now noted Mild bibasilar atelectasis/ scarring is present. There is no evidence of focal airspace disease, pulmonary edema, suspicious pulmonary nodule/mass, pleural effusion, or pneumothorax. No acute bony abnormalities are identified. IMPRESSION: Increased peribronchial thickening which may represent bronchitis. No evidence of focal pneumonia. Mild chronic bibasilar atelectasis/scarring. Electronically Signed   By: Margarette Canada M.D.   On: 09/08/2016 09:03   Ct Angio Chest Pe W/cm &/or Wo Cm  Result Date: 09/08/2016 CLINICAL DATA:  41 year old female with chest pain and elevated D-dimer. History of breast cancer. EXAM: CT ANGIOGRAPHY CHEST WITH CONTRAST TECHNIQUE: Multidetector CT imaging of the chest was  performed using the standard protocol during bolus administration of intravenous contrast. Multiplanar CT image reconstructions and MIPs were obtained to evaluate the vascular anatomy. CONTRAST:  100 cc intravenous Isovue 370 COMPARISON:  09/08/2016 chest radiograph FINDINGS: Cardiovascular: This is a technically adequate study but respiratory motion artifact slightly decreases sensitivity. No pulmonary emboli are identified. There is no evidence of thoracic aortic aneurysm. Upper limits normal heart size noted. There is no evidence of pericardial effusion. Mediastinum/Nodes: No enlarged lymph nodes or mediastinal mass. Lungs/Pleura: Innumerable small pulmonary nodules are identified bilaterally compatible with metastatic disease. Bibasilar atelectasis is present. There is no evidence of pleural effusion or pneumothorax. Upper Abdomen: Innumerable hepatic masses are compatible with metastatic disease. Musculoskeletal: Bilateral breast prosthesis are noted. Surgical changes within both breasts and left axilla noted. No acute or suspicious bony abnormalities are identified. Review of the MIP images confirms the above findings. IMPRESSION: Innumerable small pulmonary nodules  and hepatic masses, compatible metastatic disease. No evidence of pulmonary emboli. Bibasilar atelectasis. Electronically Signed   By: Margarette Canada M.D.   On: 09/08/2016 11:06   US Abdomen Limited Ruq  Result Date: 09/08/2016 CLINICAL DATA:  41 year old female with right upper quadrant abdominal pain for 3 days. History of breast cancer. EXAM: US ABDOMEN LIMITED - RIGHT UPPER QUADRANT COMPARISON:  None. FINDINGS: Gallbladder: The gallbladder is unremarkable. There is no evidence of cholelithiasis or acute cholecystitis. Common bile duct: Diameter: 4.5 mm. There is no evidence of intrahepatic or extrahepatic biliary dilatation. Liver: The liver is heterogeneous with multiple ill-defined hypoechoic areas/masses. IMPRESSION: Heterogeneous liver with  multiple ill-defined hypoechoic areas/masses. This is suspicious for metastatic disease and CT of the abdomen and pelvis with contrast recommended for further evaluation. Unremarkable gallbladder.  No biliary dilatation. Electronically Signed   By: Margarette Canada M.D.   On: 09/08/2016 10:26    Procedures Procedures (including critical care time)  Medications Ordered in ED Medications  iopamidol (ISOVUE-370) 76 % injection (not administered)  iopamidol (ISOVUE-370) 76 % injection 100 mL (100 mLs Intravenous Contrast Given 09/08/16 1029)     Initial Impression / Assessment and Plan / ED Course  I have reviewed the triage vital signs and the nursing notes.  Pertinent labs & imaging results that were available during my care of the patient were reviewed by me and considered in my medical decision making (see chart for details).  Clinical Course as of Sep 08 1152  Sun Sep 08, 2016  1047 PT with pleuritic RUQ pain. D-dimer elevated, CT angio ordered.  RUQ [place] neg for gallstones, but radiologist recommending CT abd due to multiple liver lesions suspicious for metastatic dz.  [MB]    Clinical Course User Index [MB] Malvin Johns, MD    Patient is a 41 year old female with right upper quadrant abdominal pain. No vomiting or fevers. CT scan of her chest was done to rule out pulmonary embolus given her elevated d-dimer. This is negative for embolus but there are metastatic lesions present. There is also metastatic lesions plate present in her liver based on ultrasound. No evidence of gallbladder disease. I originally ordered a CT scan of her abdomen pelvis however given that she's artery had one contrast flow, the radiologist requested this be done on an outpatient basis. I feel this is appropriate given her current abdominal exam and unremarkable labs. Her LFTs are mildly elevated which could be resulting from the metastatic lesions. She has an oncologist in [facility]. She has a follow-up appointment on  Friday. Return precautions were given.  Final Clinical Impressions(s) / ED Diagnoses   Final diagnoses:  RUQ pain  Multiple lesions of metastatic malignancy [facility])    New Prescriptions New Prescriptions   No medications on file     Malvin Johns, MD 09/08/16 1156

## 2016-09-08 NOTE — ED Triage Notes (Signed)
Pt c/o rt lower quadrant pain to rt flank starting Thursday night.  Pt states it hurts to touch and to cough/take deep breath.  Pt went to urgent care on Friday.  States that they took blood and discharged her but did not "tell her anything".  Pt also c/o left arm pain.  Hx of neuropathy with same pain.

## 2016-09-17 ENCOUNTER — Other Ambulatory Visit: Payer: Self-pay | Admitting: Family Medicine

## 2016-09-17 DIAGNOSIS — R1011 Right upper quadrant pain: Secondary | ICD-10-CM

## 2016-09-20 ENCOUNTER — Emergency Department (HOSPITAL_COMMUNITY)
Admission: EM | Admit: 2016-09-20 | Discharge: 2016-09-20 | Disposition: A | Discharge status: Home / Self Care | Payer: 59 | Attending: Emergency Medicine | Admitting: Emergency Medicine

## 2016-09-20 ENCOUNTER — Emergency Department (HOSPITAL_COMMUNITY): Payer: 59

## 2016-09-20 DIAGNOSIS — Z853 Personal history of malignant neoplasm of breast: Secondary | ICD-10-CM | POA: Diagnosis not present

## 2016-09-20 DIAGNOSIS — M79602 Pain in left arm: Secondary | ICD-10-CM

## 2016-09-20 DIAGNOSIS — Z79899 Other long term (current) drug therapy: Secondary | ICD-10-CM | POA: Diagnosis not present

## 2016-09-20 DIAGNOSIS — Z7984 Long term (current) use of oral hypoglycemic drugs: Secondary | ICD-10-CM | POA: Diagnosis not present

## 2016-09-20 DIAGNOSIS — E119 Type 2 diabetes mellitus without complications: Secondary | ICD-10-CM | POA: Insufficient documentation

## 2016-09-20 DIAGNOSIS — C7951 Secondary malignant neoplasm of bone: Secondary | ICD-10-CM | POA: Diagnosis not present

## 2016-09-20 DIAGNOSIS — M79622 Pain in left upper arm: Secondary | ICD-10-CM | POA: Diagnosis not present

## 2016-09-20 LAB — CBC
HEMATOCRIT: 36.1 % (ref 36.0–46.0)
HEMOGLOBIN: 11.9 g/dL — AB (ref 12.0–15.0)
MCH: 29.8 pg (ref 26.0–34.0)
MCHC: 33 g/dL (ref 30.0–36.0)
MCV: 90.3 fL (ref 78.0–100.0)
Platelets: 271 10*3/uL (ref 150–400)
RBC: 4 MIL/uL (ref 3.87–5.11)
RDW: 12.7 % (ref 11.5–15.5)
WBC: 8 10*3/uL (ref 4.0–10.5)

## 2016-09-20 LAB — BASIC METABOLIC PANEL
Anion gap: 9 (ref 5–15)
BUN: 14 mg/dL (ref 6–20)
CHLORIDE: 106 mmol/L (ref 101–111)
CO2: 24 mmol/L (ref 22–32)
Calcium: 9.2 mg/dL (ref 8.9–10.3)
Creatinine, Ser: 0.71 mg/dL (ref 0.44–1.00)
GFR calc Af Amer: >60 mL/min (ref >60)
GFR calc non Af Amer: >60 mL/min (ref >60)
Glucose, Bld: 152 mg/dL — ABNORMAL HIGH (ref 65–99)
POTASSIUM: 3.5 mmol/L (ref 3.5–5.1)
Sodium: 139 mmol/L (ref 135–145)

## 2016-09-20 MED ORDER — TRAMADOL HCL 50 MG PO TABS: 50.0000 mg | ORAL_TABLET | Freq: Four times a day (QID) | ORAL | 0 refills | Status: DC | PRN

## 2016-09-20 MED ORDER — ACETAMINOPHEN 325 MG PO TABS
650.0000 mg | ORAL_TABLET | Freq: Once | ORAL | Status: AC
  Administered 2016-09-20: 650 mg via ORAL
  Filled 2016-09-20: qty 2

## 2016-09-20 NOTE — ED Provider Notes (Signed)
[facility] DEPT Provider Note   CSN: [id number] Arrival date & time: 09/20/16  6440     History   Chief Complaint Chief Complaint  Patient presents with  . Arm Pain  . Weakness    HPI Rita Frazier is a 41 y.o. female.  HPI Pt has history of breast ca.  She was treated 2 years ago.  She has had some trouble with neuropathy in the past.  A couple of weeks ago she was evaluted  In the ED and was noted to have possible metastatic liver lesions.  She is scheduled for a biopsy by her oncologist.  About a month ago she started having numbness of her left arm, the whole arm.  In the last 4-5 days though she started having pain as well in that arm.   It does feel weak as well.  No fevers.   No headache or vomiting.  NO speech issues.  No balance issues Past Medical History:  Diagnosis Date  . Anemia   . Cancer ([place])    breast  . Diabetes mellitus, type 2 [facility])     Patient Active Problem List   Diagnosis Date Noted  . Routine gynecological examination 07/02/2012  . Excessive or frequent menstruation 07/02/2012  . Vaginitis and vulvovaginitis, unspecified 07/02/2012  . Excessive or frequent menstruation 07/02/2012  . Screening for malignant neoplasm of the cervix 07/02/2012    Past Surgical History:  Procedure Laterality Date  . ANKLE SURGERY    . BREAST LUMPECTOMY    . FRACTURE SURGERY    . PORTACATH PLACEMENT      OB History    No data available       Home Medications    Prior to Admission medications   Medication Sig Start Date End Date Taking? Authorizing Provider  BIOTIN PO Take 1 tablet by mouth daily.   Yes [provider]  ibuprofen (ADVIL,MOTRIN) 200 MG tablet Take 400 mg by mouth every 6 (six) hours as needed for fever, headache, mild pain, moderate pain or cramping.   Yes [provider]  metFORMIN (GLUCOPHAGE-XR) 500 MG 24 hr tablet Take 500 mg by mouth daily.   Yes [provider]  traMADol (ULTRAM) 50 MG tablet Take 1  tablet (50 mg total) by mouth every 6 (six) hours as needed. 09/20/16   Dorie Rank, MD    Family History Family History  Problem Relation Age of Onset  . Cancer Mother   . Cancer Sister     Social History Social History  Substance Use Topics  . Smoking status: Never Smoker  . Smokeless tobacco: Never Used  . Alcohol use No     Allergies   Patient has no known allergies.   Review of Systems Review of Systems   Physical Exam Updated Vital Signs BP (!) 145/106 (BP Location: Left Arm)   Pulse (!) 106   Temp 97.6 F (36.4 C) (Oral)   Resp 18   LMP 09/03/2016   SpO2 97%   Physical Exam  Constitutional: She is oriented to person, place, and time. She appears well-developed and well-nourished. No distress.  HENT:  Head: Normocephalic and atraumatic.  Right Ear: External ear normal.  Left Ear: External ear normal.  Mouth/Throat: Oropharynx is clear and moist.  Eyes: Conjunctivae are normal. Right eye exhibits no discharge. Left eye exhibits no discharge. No scleral icterus.  Neck: Neck supple. No tracheal deviation present.  Cardiovascular: Normal rate, regular rhythm and intact distal pulses.   Pulmonary/Chest:  Effort normal and breath sounds normal. No stridor. No respiratory distress. She has no wheezes. She has no rales.  Abdominal: Soft. Bowel sounds are normal. She exhibits no distension. There is no tenderness. There is no rebound and no guarding.  Musculoskeletal: She exhibits no edema or tenderness.  Neurological: She is alert and oriented to person, place, and time. She has normal strength. No cranial nerve deficit (No facial droop, extraocular movements intact, tongue midline ) or sensory deficit. She exhibits normal muscle tone. She displays no seizure activity. Coordination normal.  Weak grip strength and arm abduction left arm, right normal, able to hold both legs off bed for 5 seconds, sensation intact in all extremities, no visual field cuts, no left or right  sided neglect, normal finger-nose exam bilaterally, no nystagmus noted   Skin: Skin is warm and dry. No rash noted.  Psychiatric: She has a normal mood and affect.  Nursing note and vitals reviewed.    ED Treatments / Results  Labs (all labs ordered are listed, but only abnormal results are displayed) Labs Reviewed  CBC - Abnormal; Notable for the following:       Result Value   Hemoglobin 11.9 (*)    All other components within normal limits  BASIC METABOLIC PANEL - Abnormal; Notable for the following:    Glucose, Bld 152 (*)    All other components within normal limits    EKG  EKG Interpretation  Date/Time:  Friday September 20 2016 09:49:06 EDT Ventricular Rate:  92 PR Interval:    QRS Duration: 88 QT Interval:  347 QTC Calculation: 430 R Axis:   71 Text Interpretation:  Sinus rhythm Since last tracing rate slower Confirmed by Dorie Rank [phone]) on 09/20/2016 10:04:31 AM       Radiology Ct Head Wo Contrast  Result Date: 09/20/2016 CLINICAL DATA:  Left arm weakness and tingling for several months. History of breast cancer and diabetes. EXAM: CT HEAD WITHOUT CONTRAST TECHNIQUE: Contiguous axial images were obtained from the base of the skull through the vertex without intravenous contrast. COMPARISON:  Brain MRI 11/23/2015 FINDINGS: Brain: There is no evidence of acute infarct, intracranial hemorrhage, mass, midline shift, or extra-axial fluid collection. The ventricles and sulci are normal. Vascular: No hyperdense vessel. Skull: No fracture or suspicious osseous lesion. Sinuses/Orbits: Visualized paranasal sinuses and mastoid air cells are clear. Orbits are unremarkable. Other: None. IMPRESSION: Unremarkable head CT. Electronically Signed   By: Logan Bores M.D.   On: 09/20/2016 10:46   Mr Cervical Spine Wo Contrast  Result Date: 09/20/2016 CLINICAL DATA:  Left arm pain and weakness. Breast cancer. Recent diagnosis of possible liver metastasis. EXAM: MRI CERVICAL, THORACIC   SPINE WITHOUT CONTRAST TECHNIQUE: Multiplanar and multiecho pulse sequences of the cervical spine, to include the craniocervical junction and cervicothoracic junction, and thoracic spine, were obtained without intravenous contrast. COMPARISON:  None. FINDINGS: MRI CERVICAL SPINE FINDINGS Alignment: Normal Vertebrae: Normal Cord: Negative for fracture or mass. Image quality degraded by patient positioning and mild motion. Allowing for this the bone marrow appears normal. Posterior Fossa, vertebral arteries, paraspinal tissues: Normal signal and morphology. No cord compression Disc levels: Mild disc degeneration at C4-5 and C5-6. Negative for disc protrusion or spinal stenosis. MRI THORACIC SPINE FINDINGS Alignment:  Normal Vertebrae: 15 mm rounded lesion T10 vertebral body low signal on T1 and T2. Similar 15 mm rounded lesion L1 vertebral body. Given history breast cancer, these are most likely due to metastatic disease. No epidural tumor. Negative for  fracture. Cord:  Normal cord signal.  No cord compression Paraspinal and other soft tissues: Small right pleural effusion. Multiple liver lesions, suspicious for metastatic disease. Disc levels: No significant disc degeneration or disc protrusion in the thoracic spine IMPRESSION: Negative MRI cervical spine Mass lesions T10 and L1 vertebral bodies, compatible with metastatic breast cancer. No fracture or epidural tumor. Negative for cord compression Small right effusion.  Multiple liver lesions Electronically Signed   By: Franchot Gallo M.D.   On: 09/20/2016 14:26   Mr Thoracic Spine Wo Contrast  Result Date: 09/20/2016 CLINICAL DATA:  Left arm pain and weakness. Breast cancer. Recent diagnosis of possible liver metastasis. EXAM: MRI CERVICAL, THORACIC  SPINE WITHOUT CONTRAST TECHNIQUE: Multiplanar and multiecho pulse sequences of the cervical spine, to include the craniocervical junction and cervicothoracic junction, and thoracic spine, were obtained without  intravenous contrast. COMPARISON:  None. FINDINGS: MRI CERVICAL SPINE FINDINGS Alignment: Normal Vertebrae: Normal Cord: Negative for fracture or mass. Image quality degraded by patient positioning and mild motion. Allowing for this the bone marrow appears normal. Posterior Fossa, vertebral arteries, paraspinal tissues: Normal signal and morphology. No cord compression Disc levels: Mild disc degeneration at C4-5 and C5-6. Negative for disc protrusion or spinal stenosis. MRI THORACIC SPINE FINDINGS Alignment:  Normal Vertebrae: 15 mm rounded lesion T10 vertebral body low signal on T1 and T2. Similar 15 mm rounded lesion L1 vertebral body. Given history breast cancer, these are most likely due to metastatic disease. No epidural tumor. Negative for fracture. Cord:  Normal cord signal.  No cord compression Paraspinal and other soft tissues: Small right pleural effusion. Multiple liver lesions, suspicious for metastatic disease. Disc levels: No significant disc degeneration or disc protrusion in the thoracic spine IMPRESSION: Negative MRI cervical spine Mass lesions T10 and L1 vertebral bodies, compatible with metastatic breast cancer. No fracture or epidural tumor. Negative for cord compression Small right effusion.  Multiple liver lesions Electronically Signed   By: Franchot Gallo M.D.   On: 09/20/2016 14:26    Procedures Procedures (including critical care time)  Medications Ordered in ED Medications  acetaminophen (TYLENOL) tablet 650 mg (650 mg Oral Given 09/20/16 1138)     Initial Impression / Assessment and Plan / ED Course  I have reviewed the triage vital signs and the nursing notes.  Pertinent labs & imaging results that were available during my care of the patient were reviewed by me and considered in my medical decision making (see chart for details).  Clinical Course as of Sep 20 1533  Fri Sep 20, 2016  1105 Patient presents to emergency room with symptoms concerning for radiculopathy. She  has weakness and numbness of her left arm. No other neurologic findings. No facial droop or speech disturbance. No symptoms affecting her left lower extremity. CT scan does not show any evidence of stroke, edema or tumor. I'm more concerned about the possibility of metastatic lesion affecting her cervical or thoracic spine. Get an MRI in the emergency room  [JK]    Clinical Course User Index [JK] Dorie Rank, MD    No sign of chord impignement.  No lesions on mri concerning for metastatic disease.  Doubt stroke or other acute neurologic emergency.  Will dc home on pain meds.  Discussed the mri findings.  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     Dorie Rank, MD 09/20/16 1536

## 2016-09-20 NOTE — ED Triage Notes (Addendum)
Pt c/o worsened left arm tingling, numbness, and weakness x 5 days. Pain worse at night. 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 Weakness is new. No new symptoms today. No vision changes, confusion, other weakness, fevers. Hx breast cancer. New lesions noted to liver, waiting on biopsy.

## 2016-09-20 NOTE — Discharge Instructions (Signed)
The mri showed possible metastatic bone lesions, no sign of chord or nerve impingement, take the medications  as needed for pain, follow up with your oncologist for further treatment

## 2016-11-02 ENCOUNTER — Emergency Department (HOSPITAL_COMMUNITY): Payer: 59

## 2016-11-02 ENCOUNTER — Encounter (HOSPITAL_COMMUNITY): Payer: Self-pay

## 2016-11-02 ENCOUNTER — Inpatient Hospital Stay (HOSPITAL_COMMUNITY)
Admission: EM | Admit: 2016-11-02 | Discharge: 2016-11-07 | DRG: 872 | Disposition: A | Discharge status: Home / Self Care | Payer: 59 | Attending: Internal Medicine | Admitting: Internal Medicine

## 2016-11-02 DIAGNOSIS — Z6832 Body mass index (BMI) 32.0-32.9, adult: Secondary | ICD-10-CM | POA: Diagnosis not present

## 2016-11-02 DIAGNOSIS — R609 Edema, unspecified: Secondary | ICD-10-CM

## 2016-11-02 DIAGNOSIS — K208 Other esophagitis: Secondary | ICD-10-CM | POA: Diagnosis present

## 2016-11-02 DIAGNOSIS — C50912 Malignant neoplasm of unspecified site of left female breast: Secondary | ICD-10-CM | POA: Diagnosis present

## 2016-11-02 DIAGNOSIS — E46 Unspecified protein-calorie malnutrition: Secondary | ICD-10-CM | POA: Diagnosis present

## 2016-11-02 DIAGNOSIS — J910 Malignant pleural effusion: Secondary | ICD-10-CM | POA: Diagnosis present

## 2016-11-02 DIAGNOSIS — E119 Type 2 diabetes mellitus without complications: Secondary | ICD-10-CM | POA: Diagnosis present

## 2016-11-02 DIAGNOSIS — Z7901 Long term (current) use of anticoagulants: Secondary | ICD-10-CM

## 2016-11-02 DIAGNOSIS — Z809 Family history of malignant neoplasm, unspecified: Secondary | ICD-10-CM

## 2016-11-02 DIAGNOSIS — J029 Acute pharyngitis, unspecified: Secondary | ICD-10-CM | POA: Diagnosis not present

## 2016-11-02 DIAGNOSIS — R509 Fever, unspecified: Secondary | ICD-10-CM | POA: Diagnosis present

## 2016-11-02 DIAGNOSIS — D703 Neutropenia due to infection: Secondary | ICD-10-CM | POA: Diagnosis present

## 2016-11-02 DIAGNOSIS — D709 Neutropenia, unspecified: Secondary | ICD-10-CM | POA: Diagnosis present

## 2016-11-02 DIAGNOSIS — E876 Hypokalemia: Secondary | ICD-10-CM | POA: Diagnosis present

## 2016-11-02 DIAGNOSIS — I82A12 Acute embolism and thrombosis of left axillary vein: Secondary | ICD-10-CM | POA: Diagnosis not present

## 2016-11-02 DIAGNOSIS — C50919 Malignant neoplasm of unspecified site of unspecified female breast: Secondary | ICD-10-CM

## 2016-11-02 DIAGNOSIS — Z7984 Long term (current) use of oral hypoglycemic drugs: Secondary | ICD-10-CM | POA: Diagnosis not present

## 2016-11-02 DIAGNOSIS — C787 Secondary malignant neoplasm of liver and intrahepatic bile duct: Secondary | ICD-10-CM | POA: Diagnosis present

## 2016-11-02 DIAGNOSIS — E860 Dehydration: Secondary | ICD-10-CM | POA: Diagnosis present

## 2016-11-02 DIAGNOSIS — J90 Pleural effusion, not elsewhere classified: Secondary | ICD-10-CM

## 2016-11-02 DIAGNOSIS — M79609 Pain in unspecified limb: Secondary | ICD-10-CM | POA: Diagnosis not present

## 2016-11-02 DIAGNOSIS — K209 Esophagitis, unspecified without bleeding: Secondary | ICD-10-CM | POA: Diagnosis present

## 2016-11-02 DIAGNOSIS — B3781 Candidal esophagitis: Secondary | ICD-10-CM | POA: Diagnosis not present

## 2016-11-02 DIAGNOSIS — C7800 Secondary malignant neoplasm of unspecified lung: Secondary | ICD-10-CM | POA: Diagnosis present

## 2016-11-02 DIAGNOSIS — R5081 Fever presenting with conditions classified elsewhere: Secondary | ICD-10-CM | POA: Diagnosis present

## 2016-11-02 DIAGNOSIS — A419 Sepsis, unspecified organism: Principal | ICD-10-CM | POA: Diagnosis present

## 2016-11-02 DIAGNOSIS — M7989 Other specified soft tissue disorders: Secondary | ICD-10-CM | POA: Diagnosis not present

## 2016-11-02 DIAGNOSIS — E118 Type 2 diabetes mellitus with unspecified complications: Secondary | ICD-10-CM

## 2016-11-02 DIAGNOSIS — R1110 Vomiting, unspecified: Secondary | ICD-10-CM

## 2016-11-02 LAB — GLUCOSE, CAPILLARY
Glucose-Capillary: 180 mg/dL — ABNORMAL HIGH (ref 65–99)
Glucose-Capillary: 126 mg/dL — ABNORMAL HIGH (ref 65–99)

## 2016-11-02 LAB — CBC WITH DIFFERENTIAL/PLATELET
Basophils Absolute: 0 10*3/uL (ref 0.0–0.1)
Basophils Relative: 0 %
Eosinophils Absolute: 0 10*3/uL (ref 0.0–0.7)
Eosinophils Relative: 0 %
HCT: 31.2 % — ABNORMAL LOW (ref 36.0–46.0)
Hemoglobin: 10.5 g/dL — ABNORMAL LOW (ref 12.0–15.0)
Lymphocytes Relative: 78 %
Lymphs Abs: 0.8 10*3/uL (ref 0.7–4.0)
MCH: 29.4 pg (ref 26.0–34.0)
MCHC: 33.7 g/dL (ref 30.0–36.0)
MCV: 87.4 fL (ref 78.0–100.0)
Monocytes Absolute: 0.2 10*3/uL (ref 0.1–1.0)
Monocytes Relative: 20 %
Neutro Abs: 0 10*3/uL — ABNORMAL LOW (ref 1.7–7.7)
Neutrophils Relative %: 2 %
Platelets: 136 10*3/uL — ABNORMAL LOW (ref 150–400)
RBC: 3.57 MIL/uL — ABNORMAL LOW (ref 3.87–5.11)
RDW: 15.1 % (ref 11.5–15.5)
WBC: 1 10*3/uL — CL (ref 4.0–10.5)

## 2016-11-02 LAB — TYPE AND SCREEN
ABO/RH(D): A POS
Antibody Screen: NEGATIVE

## 2016-11-02 LAB — RAPID STREP SCREEN (MED CTR MEBANE ONLY): Streptococcus, Group A Screen (Direct): NEGATIVE

## 2016-11-02 LAB — COMPREHENSIVE METABOLIC PANEL
ALT: 34 U/L (ref 14–54)
AST: 60 U/L — ABNORMAL HIGH (ref 15–41)
Albumin: 2.6 g/dL — ABNORMAL LOW (ref 3.5–5.0)
Alkaline Phosphatase: 192 U/L — ABNORMAL HIGH (ref 38–126)
Anion gap: 10 (ref 5–15)
BUN: 7 mg/dL (ref 6–20)
CO2: 22 mmol/L (ref 22–32)
Calcium: 8.2 mg/dL — ABNORMAL LOW (ref 8.9–10.3)
Chloride: 106 mmol/L (ref 101–111)
Creatinine, Ser: 0.73 mg/dL (ref 0.44–1.00)
GFR calc Af Amer: >60 mL/min (ref >60)
GFR calc non Af Amer: >60 mL/min (ref >60)
Glucose, Bld: 162 mg/dL — ABNORMAL HIGH (ref 65–99)
Potassium: 3.2 mmol/L — ABNORMAL LOW (ref 3.5–5.1)
Sodium: 138 mmol/L (ref 135–145)
Total Bilirubin: 3.9 mg/dL — ABNORMAL HIGH (ref 0.3–1.2)
Total Protein: 7 g/dL (ref 6.5–8.1)

## 2016-11-02 LAB — I-STAT CG4 LACTIC ACID, ED
LACTIC ACID, VENOUS: 1.62 mmol/L (ref 0.5–1.9)
Lactic Acid, Venous: 1.39 mmol/L (ref 0.5–1.9)

## 2016-11-02 LAB — URINALYSIS, ROUTINE W REFLEX MICROSCOPIC
Bilirubin Urine: NEGATIVE
Glucose, UA: NEGATIVE mg/dL
Ketones, ur: 5 mg/dL — AB
Leukocytes, UA: NEGATIVE
Nitrite: NEGATIVE
Protein, ur: NEGATIVE mg/dL
Specific Gravity, Urine: 1.004 — ABNORMAL LOW (ref 1.005–1.030)
pH: 6 (ref 5.0–8.0)

## 2016-11-02 LAB — PROTIME-INR
INR: 2.64
INR: 2.65
PROTHROMBIN TIME: 28.8 s — AB (ref 11.4–15.2)
Prothrombin Time: 28.7 seconds — ABNORMAL HIGH (ref 11.4–15.2)

## 2016-11-02 LAB — LACTIC ACID, PLASMA
Lactic Acid, Venous: 1.9 mmol/L (ref 0.5–1.9)
Lactic Acid, Venous: 1.7 mmol/L (ref 0.5–1.9)

## 2016-11-02 LAB — ABO/RH: ABO/RH(D): A POS

## 2016-11-02 LAB — APTT: APTT: 42 s — AB (ref 24–36)

## 2016-11-02 LAB — CBG MONITORING, ED: Glucose-Capillary: 146 mg/dL — ABNORMAL HIGH (ref 65–99)

## 2016-11-02 MED ORDER — MAGNESIUM CITRATE PO SOLN: 1.0000 | Freq: Once | ORAL | Status: DC | PRN

## 2016-11-02 MED ORDER — FLUCONAZOLE IN SODIUM CHLORIDE 200-0.9 MG/100ML-% IV SOLN
200.0000 mg | INTRAVENOUS | Status: DC
  Administered 2016-11-02 – 2016-11-04 (×3): 200 mg via INTRAVENOUS
  Filled 2016-11-02 (×4): qty 100

## 2016-11-02 MED ORDER — ACETAMINOPHEN 325 MG PO TABS
650.0000 mg | ORAL_TABLET | Freq: Once | ORAL | Status: AC | PRN
  Administered 2016-11-02: 650 mg via ORAL
  Filled 2016-11-02 (×2): qty 2

## 2016-11-02 MED ORDER — BISACODYL 5 MG PO TBEC: 5.0000 mg | DELAYED_RELEASE_TABLET | Freq: Every day | ORAL | Status: DC | PRN

## 2016-11-02 MED ORDER — VANCOMYCIN HCL IN DEXTROSE 1-5 GM/200ML-% IV SOLN
1000.0000 mg | Freq: Once | INTRAVENOUS | Status: AC
  Administered 2016-11-02: 1000 mg via INTRAVENOUS
  Filled 2016-11-02: qty 200

## 2016-11-02 MED ORDER — ACETAMINOPHEN 325 MG PO TABS
650.0000 mg | ORAL_TABLET | Freq: Four times a day (QID) | ORAL | Status: DC | PRN
  Administered 2016-11-05 – 2016-11-07 (×2): 650 mg via ORAL
  Filled 2016-11-02 (×2): qty 2

## 2016-11-02 MED ORDER — ONDANSETRON HCL 4 MG/2ML IJ SOLN
4.0000 mg | Freq: Once | INTRAMUSCULAR | Status: AC
  Administered 2016-11-02: 4 mg via INTRAVENOUS
  Filled 2016-11-02: qty 2

## 2016-11-02 MED ORDER — VANCOMYCIN HCL IN DEXTROSE 1-5 GM/200ML-% IV SOLN
1000.0000 mg | Freq: Three times a day (TID) | INTRAVENOUS | Status: DC
  Administered 2016-11-02 – 2016-11-03 (×4): 1000 mg via INTRAVENOUS
  Filled 2016-11-02 (×4): qty 200

## 2016-11-02 MED ORDER — KETOROLAC TROMETHAMINE 30 MG/ML IJ SOLN
30.0000 mg | Freq: Four times a day (QID) | INTRAMUSCULAR | Status: DC | PRN
  Administered 2016-11-05 – 2016-11-07 (×3): 30 mg via INTRAVENOUS
  Filled 2016-11-02 (×3): qty 1

## 2016-11-02 MED ORDER — MAGIC MOUTHWASH W/LIDOCAINE
5.0000 mL | ORAL | Status: DC
  Administered 2016-11-02 – 2016-11-07 (×20): 5 mL via ORAL
  Filled 2016-11-02 (×38): qty 5

## 2016-11-02 MED ORDER — ACETAMINOPHEN 650 MG RE SUPP: 650.0000 mg | Freq: Four times a day (QID) | RECTAL | Status: DC | PRN

## 2016-11-02 MED ORDER — APIXABAN 5 MG PO TABS
5.0000 mg | ORAL_TABLET | Freq: Two times a day (BID) | ORAL | Status: DC
  Administered 2016-11-03 – 2016-11-07 (×9): 5 mg via ORAL
  Filled 2016-11-02 (×10): qty 1

## 2016-11-02 MED ORDER — IOPAMIDOL (ISOVUE-370) INJECTION 76%
INTRAVENOUS | Status: AC
  Filled 2016-11-02: qty 100

## 2016-11-02 MED ORDER — SODIUM CHLORIDE 0.9% FLUSH
3.0000 mL | Freq: Two times a day (BID) | INTRAVENOUS | Status: DC
  Administered 2016-11-02 – 2016-11-07 (×7): 3 mL via INTRAVENOUS

## 2016-11-02 MED ORDER — ONDANSETRON 4 MG PO TBDP
8.0000 mg | ORAL_TABLET | Freq: Three times a day (TID) | ORAL | Status: DC | PRN
  Filled 2016-11-02: qty 2

## 2016-11-02 MED ORDER — ZOLPIDEM TARTRATE 5 MG PO TABS
5.0000 mg | ORAL_TABLET | Freq: Every evening | ORAL | Status: DC | PRN
  Filled 2016-11-02 (×2): qty 1

## 2016-11-02 MED ORDER — DEXTROSE 5 % IV SOLN
2.0000 g | Freq: Three times a day (TID) | INTRAVENOUS | Status: DC
  Administered 2016-11-02 – 2016-11-04 (×8): 2 g via INTRAVENOUS
  Filled 2016-11-02 (×11): qty 2

## 2016-11-02 MED ORDER — DEXTROSE 5 % IV SOLN
1.0000 g | Freq: Three times a day (TID) | INTRAVENOUS | Status: DC
  Filled 2016-11-02 (×2): qty 1

## 2016-11-02 MED ORDER — PIPERACILLIN-TAZOBACTAM 3.375 G IVPB 30 MIN
3.3750 g | Freq: Once | INTRAVENOUS | Status: AC
  Administered 2016-11-02: 3.375 g via INTRAVENOUS
  Filled 2016-11-02: qty 50

## 2016-11-02 MED ORDER — HYDROMORPHONE HCL 2 MG PO TABS
4.0000 mg | ORAL_TABLET | Freq: Four times a day (QID) | ORAL | Status: DC | PRN
Start: 2016-11-02 — End: 2016-11-02
  Administered 2016-11-02: 4 mg via ORAL
  Filled 2016-11-02 (×2): qty 2

## 2016-11-02 MED ORDER — ONDANSETRON HCL 4 MG/2ML IJ SOLN
4.0000 mg | Freq: Four times a day (QID) | INTRAMUSCULAR | Status: DC | PRN
  Administered 2016-11-02 – 2016-11-07 (×5): 4 mg via INTRAVENOUS
  Filled 2016-11-02 (×5): qty 2

## 2016-11-02 MED ORDER — HYDROMORPHONE HCL-NACL 0.5-0.9 MG/ML-% IV SOSY
1.0000 mg | PREFILLED_SYRINGE | INTRAVENOUS | Status: DC | PRN
  Administered 2016-11-02 – 2016-11-07 (×12): 1 mg via INTRAVENOUS
  Filled 2016-11-02 (×13): qty 2

## 2016-11-02 MED ORDER — SENNOSIDES-DOCUSATE SODIUM 8.6-50 MG PO TABS: 1.0000 | ORAL_TABLET | Freq: Every evening | ORAL | Status: DC | PRN

## 2016-11-02 MED ORDER — IOPAMIDOL (ISOVUE-370) INJECTION 76%
100.0000 mL | Freq: Once | INTRAVENOUS | Status: AC | PRN
  Administered 2016-11-02: 100 mL via INTRAVENOUS

## 2016-11-02 MED ORDER — PIPERACILLIN-TAZOBACTAM 3.375 G IVPB
3.3750 g | Freq: Three times a day (TID) | INTRAVENOUS | Status: DC
Start: 2016-11-02 — End: 2016-11-02
  Filled 2016-11-02: qty 50

## 2016-11-02 MED ORDER — INSULIN ASPART 100 UNIT/ML ~~LOC~~ SOLN
0.0000 [IU] | Freq: Three times a day (TID) | SUBCUTANEOUS | Status: DC
  Administered 2016-11-02 – 2016-11-03 (×2): 3 [IU] via SUBCUTANEOUS
  Administered 2016-11-04 – 2016-11-07 (×2): 2 [IU] via SUBCUTANEOUS

## 2016-11-02 MED ORDER — SODIUM CHLORIDE 0.9 % IV BOLUS (SEPSIS)
3000.0000 mL | Freq: Once | INTRAVENOUS | Status: AC
  Administered 2016-11-02: 3000 mL via INTRAVENOUS

## 2016-11-02 MED ORDER — POTASSIUM CHLORIDE IN NACL 40-0.9 MEQ/L-% IV SOLN
INTRAVENOUS | Status: DC
  Administered 2016-11-02 – 2016-11-03 (×3): 125 mL/h via INTRAVENOUS
  Filled 2016-11-02 (×5): qty 1000

## 2016-11-02 MED ORDER — INSULIN ASPART 100 UNIT/ML ~~LOC~~ SOLN
4.0000 [IU] | Freq: Three times a day (TID) | SUBCUTANEOUS | Status: DC
  Administered 2016-11-02 – 2016-11-03 (×2): 4 [IU] via SUBCUTANEOUS

## 2016-11-02 MED ORDER — LORAZEPAM 2 MG/ML IJ SOLN
0.5000 mg | Freq: Once | INTRAMUSCULAR | Status: AC
  Administered 2016-11-02: 0.5 mg via INTRAVENOUS
  Filled 2016-11-02: qty 1

## 2016-11-02 NOTE — ED Triage Notes (Addendum)
Pt presents w/ 103.2 fever. 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 Pt reports 103.1 fever at home prior to arriving this AM. Pt last IV Chemo was 7/20.

## 2016-11-02 NOTE — ED Notes (Signed)
Unable to collect second set of culture at this time PT transfer to xray

## 2016-11-02 NOTE — Progress Notes (Signed)
Pharmacy Antibiotic Note  Rita Frazier is a 41 y.o. female admitted on 11/02/2016 with sepsis and Febrile Neutropenia.  Pharmacy has been consulted for Vancomycin and Zosyn dosing. Noted 4 day hx sore throat, 2 days malaise/fatigue, fever to 103 on admit 7/28, productive cough Hx of Breast Ca, lung mets; chemo [facility], unable to receive 7/27 - low Plt (last chemo 7/20 Taxol/Herc/Pert) - WBC 1.0, ANC = 0  Plan: Vancomycin 2gm total x1, then 1gm  IV every 8 hours.  Goal trough 15-20 mcg/mL. Zosyn 3.375g IV q8h (4 hour infusion).    Temp (24hrs), Avg:103.2 F (39.6 C), Min:103.2 F (39.6 C), Max:103.2 F (39.6 C)  No results for input(s): WBC, CREATININE, LATICACIDVEN, VANCOTROUGH, VANCOPEAK, VANCORANDOM, GENTTROUGH, GENTPEAK, GENTRANDOM, TOBRATROUGH, TOBRAPEAK, TOBRARND, AMIKACINPEAK, AMIKACINTROU, AMIKACIN in the last 168 hours.  CrCl cannot be calculated (Patient's most recent lab result is older than the maximum 21 days allowed.).    No Known Allergies  Antimicrobials this admission: 7/28 Vancomycin >>  7/28 Zosyn >>   Dose adjustments this admission:  Microbiology results: 7/28 BCx: collected 7/28 Rapid Strep: pending 7/28 Group A strep: pending  Thank you for allowing pharmacy to be a part of this patient's care.  Minda Ditto PharmD Pager [phone] 11/02/2016, 10:17 AM

## 2016-11-02 NOTE — ED Notes (Signed)
Patient actively vomiting after CT scan. PA notified and ordering zofran.

## 2016-11-02 NOTE — ED Notes (Signed)
PT AMBULATED TO THE RESTROOM. PT TOLERATED WELL.

## 2016-11-02 NOTE — ED Notes (Signed)
ED Provider at bedside. 

## 2016-11-02 NOTE — ED Notes (Signed)
Patient transported to X-ray 

## 2016-11-02 NOTE — ED Notes (Signed)
This RN spoke with Nunzio Cory in lab, states she spoke directly with lab at [facility] and the fungus culture is being added on to previous collection.

## 2016-11-02 NOTE — H&P (Addendum)
History and Physical    Rita Frazier YDX:[id number] DOB: Jun 21, 1975 DOA: 11/02/2016  PCP: Dustin Folks, MD at [facility] (oncologist)  I have personally briefly reviewed patient's old medical records in [place]  Chief Complaint: fever noted this morning.  HPI: Rita Frazier is a 41 y.o. 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 Chemotherapy held on Friday due to neutropenia and dehydration. The patient presents today toward emergency department with a high fever of 103. Her spouse states that she has not been feeling well since yesterday. He noted that she has had decreased by mouth input and wasn't really eating very well at all. They presented for their usual chemotherapy regimen and due to neutropenia the patient's chemotherapy was canceled for that day. She received some IV fluids at [facility] and then was discharged home. She was drinking okay but her throat hurt a great deal. This morning he was next to the patient and felt that she was radiating a great deal of heat checked her temperature was 103. They presented to the emergency department for further evaluation and management.  The patient's husband states that she has really not been feeling well over the past few days. They did mention to the intake nurse at [facility] yesterday that the patient had a sore throat but both the family and the nurse felt that it was related to chemotherapy as that is often a side effect and they have looked it up on the Internet.  In the emergency department the patient was fully evaluated and found to have a very dense white pharyngitis she was immediately given antibiotics labs were obtained and cultures were sent and I was consult for further evaluation and  management.  ED Course: Zosyn, vancomycin, and Diflucan at my request. Initial lactic acid 1.3 blood parameters consistent with mild sepsis. IV fluids given. Referred to me for further evaluation and management  Review of Systems: As per HPI otherwise 10 point review of systems negative.  Review of Systems  Constitutional: Positive for chills, fever and malaise/fatigue. Negative for diaphoresis.  HENT: Negative for ear pain, hearing loss and nosebleeds.   Eyes: Negative for blurred vision and double vision.  Respiratory: Positive for cough, hemoptysis (mild since eliquis begun) and sputum production (white frothy). Negative for shortness of breath and wheezing.   Cardiovascular: Negative for chest pain, palpitations and orthopnea.  Gastrointestinal: Positive for nausea and vomiting. Negative for abdominal pain, constipation and diarrhea.  Genitourinary: Negative for dysuria, frequency and urgency.  Musculoskeletal: Negative for back pain, myalgias and neck pain.  Skin: Negative for itching and rash.  Neurological: Negative for dizziness, tingling, weakness and headaches.  Endo/Heme/Allergies: Negative for environmental allergies. Does not bruise/bleed easily.  Psychiatric/Behavioral: Negative for depression and suicidal ideas.    Past Medical History:  Diagnosis Date  . Anemia   . Cancer ([place])    breast  . Diabetes mellitus, type 2 ([place])     Past Surgical History:  Procedure Laterality Date  . ANKLE SURGERY    . BREAST LUMPECTOMY    . FRACTURE SURGERY    . PORTACATH PLACEMENT       reports that she has never smoked. She has never used smokeless tobacco. She reports that she does not drink alcohol or use drugs.  No  Known Allergies  Family History  Problem Relation Age of Onset  . Cancer Mother   . Cancer Sister      Prior to Admission medications   Medication Sig Start Date End Date Taking? Authorizing Provider  ELIQUIS 5 MG TABS tablet Take 5 mg by mouth 2 (two) times  daily. 10/21/16  Yes [provider]  HYDROmorphone (DILAUDID) 4 MG tablet Take 4 mg by mouth every 6 (six) hours as needed. 10/29/16  Yes [provider]  ondansetron (ZOFRAN-ODT) 8 MG disintegrating tablet Take 8 mg by mouth every 8 (eight) hours as needed. 11/01/16  Yes [provider]  metFORMIN (GLUCOPHAGE-XR) 500 MG 24 hr tablet Take 500 mg by mouth daily.    [provider]    Physical Exam: Vitals:   11/02/16 1025 11/02/16 1112 11/02/16 1204 11/02/16 1306  BP:  121/70 118/84 (!) 144/94  Pulse:  (!) 126 (!) 131 (!) 131  Resp:  (!) 24 17 (!) 23  Temp: 98.9 F (37.2 C)     TempSrc: Oral     SpO2:  95% 91% 96%  Weight:      Height:       .TCS Constitutional: NAD, calm, Uncomfortable ill-appearing Vitals:   11/02/16 1025 11/02/16 1112 11/02/16 1204 11/02/16 1306  BP:  121/70 118/84 (!) 144/94  Pulse:  (!) 126 (!) 131 (!) 131  Resp:  (!) 24 17 (!) 23  Temp: 98.9 F (37.2 C)     TempSrc: Oral     SpO2:  95% 91% 96%  Weight:      Height:       Eyes: PERRL, lids and conjunctivae normal ENMT: Mucous membranes are moist. Posterior pharynx Significant white exudate on the posterior pharynx the uvula and the back of the throat. There is edema and swelling as well but airway is widely patent Neck: normal, supple, no masses, no thyromegaly Respiratory: Increased respiratory rate, decreased breath sounds at bilateral bases, no wheezing, no crackles. Normal respiratory effort. No accessory muscle use.  Cardiovascular: Tachycardia rate and rhythm otherwise normal, no murmurs / rubs / gallops. No extremity edema. 2+ pedal pulses. No carotid bruits.  Abdomen: no tenderness, no masses palpated. No hepatosplenomegaly. Bowel sounds positive.  Musculoskeletal: no clubbing / cyanosis. No joint deformity upper and lower extremities. Good ROM, no contractures. Normal muscle tone.  Skin: no rashes, lesions, ulcers. No induration Neurologic: CN 2-12 grossly  intact. Sensation intact, DTR normal. Strength 5/5 in all 4.  Psychiatric: Normal judgment and insight. Alert and oriented x 3. Normal mood.    Labs on Admission: I have personally reviewed following labs and imaging studies  CBC:  Recent Labs Lab 11/02/16 0842  WBC 1.0*  NEUTROABS 0.0*  HGB 10.5*  HCT 31.2*  MCV 87.4  PLT 829*   Basic Metabolic Panel:  Recent Labs Lab 11/02/16 0842  NA 138  K 3.2*  CL 106  CO2 22  GLUCOSE 162*  BUN 7  CREATININE 0.73  CALCIUM 8.2*   GFR: Estimated Creatinine Clearance: 121.2 mL/min (by C-G formula based on SCr of 0.73 mg/dL). Liver Function Tests:  Recent Labs Lab 11/02/16 0842  AST 60*  ALT 34  ALKPHOS 192*  BILITOT 3.9*  PROT 7.0  ALBUMIN 2.6*   No results for input(s): LIPASE, AMYLASE in the last 168 hours. No results for input(s): AMMONIA in the last 168 hours. Coagulation Profile:  Recent Labs Lab 11/02/16 0842  INR 2.64   Cardiac Enzymes: No results for  input(s): CKTOTAL, CKMB, CKMBINDEX, TROPONINI in the last 168 hours. BNP (last 3 results) No results for input(s): PROBNP in the last 8760 hours. HbA1C: No results for input(s): HGBA1C in the last 72 hours. CBG:  Recent Labs Lab 11/02/16 0912  GLUCAP 146*   Lipid Profile: No results for input(s): CHOL, HDL, LDLCALC, TRIG, CHOLHDL, LDLDIRECT in the last 72 hours. Thyroid Function Tests: No results for input(s): TSH, T4TOTAL, FREET4, T3FREE, THYROIDAB in the last 72 hours. Anemia Panel: No results for input(s): VITAMINB12, FOLATE, FERRITIN, TIBC, IRON, RETICCTPCT in the last 72 hours. Urine analysis:    Component Value Date/Time   COLORURINE YELLOW 11/02/2016 0957   APPEARANCEUR HAZY (A) 11/02/2016 0957   LABSPEC 1.004 (L) 11/02/2016 0957   PHURINE 6.0 11/02/2016 0957   GLUCOSEU NEGATIVE 11/02/2016 0957   HGBUR MODERATE (A) 11/02/2016 0957   BILIRUBINUR NEGATIVE 11/02/2016 0957   BILIRUBINUR neg 06/30/2012 1151   KETONESUR 5 (A) 11/02/2016 0957     PROTEINUR NEGATIVE 11/02/2016 0957   UROBILINOGEN 1.0 02/20/2014 2326   NITRITE NEGATIVE 11/02/2016 0957   LEUKOCYTESUR NEGATIVE 11/02/2016 0957    Radiological Exams on Admission: Dg Chest 2 View  Result Date: 11/02/2016 CLINICAL DATA:  Sepsis.  Tachycardia.  Fever. EXAM: CHEST  2 VIEW COMPARISON:  09/08/2016 FINDINGS: Normal heart size. New bilateral pleural effusions, left greater than right. Mild diffuse Innumerable pulmonary nodules are identified in both lungs. Mild diffuse superimposed pulmonary edema noted. IMPRESSION: 1. Diffuse pulmonary metastasis as before. 2. Bilateral pleural effusions and mild interstitial edema Electronically Signed   By: Kerby Moors M.D.   On: 11/02/2016 09:17   Ct Angio Chest Pe W And/or Wo Contrast  Result Date: 11/02/2016 CLINICAL DATA:  Tachycardia, metastatic cancer, hemoptysis, left upper extremity DVT. A true pain fever. Tachycardia. EXAM: CT ANGIOGRAPHY CHEST WITH CONTRAST TECHNIQUE: Multidetector CT imaging of the chest was performed using the standard protocol during bolus administration of intravenous contrast. Multiplanar CT image reconstructions and MIPs were obtained to evaluate the vascular anatomy. CONTRAST:  100 cc Isovue 370. COMPARISON:  PET 09/24/2016 and CT chest 09/08/2016. FINDINGS: Cardiovascular: Image quality is degraded by suboptimal opacification of the segmental and subsegmental pulmonary arteries as well as respiratory motion. No central or lobar pulmonary embolus. Vascular structures are unremarkable. Heart is at the upper limits of normal in size to mildly enlarged. No pericardial effusion. Mediastinum/Nodes: Right lobe of the thyroid is enlarged and contains a heterogeneous nodule measuring 2.9 cm. There is slight leftward deviation of the trachea as well. No pathologically enlarged mediastinal, hilar or axillary lymph nodes. Surgical clips in both axillary regions. Esophagus maybe slightly thickened. Lungs/Pleura: Hematogenously  distributed pulmonary nodules are seen throughout the lungs bilaterally. Index nodule in the medial right lower lobe measures 11 mm (series 11, image 46), previously 10 mm. Scattered septal thickening is seen bilaterally, as before. Bilateral pleural effusions, moderate on the right and small on the left. Compressive atelectasis in both lower lobes. Airway is unremarkable. Upper Abdomen: Multiple ill-defined mildly hypoattenuating lesions are seen in the liver, measuring up to approximately 3.5 cm centrally, as before. Visualized portions of the adrenal glands, spleen and stomach are grossly unremarkable. Musculoskeletal: Sclerotic lesions are seen in the right humeral head and lower thoracic spine. Review of the MIP images confirms the above findings. IMPRESSION: 1. Segmental and subsegmental pulmonary arteries are poorly opacified, limiting evaluation. No central or lobar pulmonary embolus. 2. Pulmonary, hepatic and osseous metastatic disease, grossly stable. 3. Bilateral pleural effusions, right greater  than left. 4. Right thyroid nodule, indeterminate. Electronically Signed   By: Lorin Picket M.D.   On: 11/02/2016 11:55    EKG: Independently reviewed. Shows sinus tachycardia otherwise unremarkable  Assessment/Plan Principal Problem:   Sepsis ([place]) Active Problems:   Neutropenia with fever (HCC)   Hypokalemia   Pharyngitis, acute   Acute esophagitis   Primary breast cancer with metastasis to other site [facility])   Acute deep vein thrombosis (DVT) of axillary vein of left upper extremity ([place])   1. Sepsis: Although patient's lactic acid is unremarkable she is acutely infected with tachycardia neutropenia and increased respiratory rate with a high fever. Her likely source is the pharyngitis. There is a high possibility that it is fungal and therefore have ordered fungal blood cultures. I have attempted to reach the microbiology lab to request that the throat culture be tested for fungus but have  been unable to reach them. Patient will be admitted into the hospital and started on vancomycin and ceftaz edema for coverage of possible bacterial sources of neutropenic fever. Given the throat and esophagus possibility for fungal infection I'm also going to start Diflucan first dose to be given in the emergency department.  She has already been given fluids in the emergency department now we'll continue that with normal saline with 40 of potassium at 125 mL per hour. He should is encouraged to drink fluids.  2. Neutropenia with fever:  Continue antibiotics as outlined above. Will check daily ANC 2 evaluate for return of immune function and appropriate white count. I do expect patient may get worse once her white count comes back as inflammation will set in. Will monitor closely.  3. Hypokalemia: Patient with difficulty swallowing at the present time the potassium pills are very large size ordered some IV potassium in her fluids. At her current rate she should be getting 120 mEq of potassium into a 24-hour period. That should be sufficient to bring up her current potassium levels will recheck potassium in the morning. Patient will be placed on telemetry.  4. Acute pharyngitis: Patient has been started on antibiotics I have included Magic mouthwash in her orders. She will receive it every 4 hours she is to swish and swallow.  5. Acute esophagitis: As above we'll continue with Magic mouthwash suspect that patient has a severe fungal esophagitis and should be treated by the Diflucan as ordered. We'll continue to monitor.  6. Primary breast cancer with metastases to other site:  Management as per [facility]. Patient is being followed by Dr. Wendee Beavers.  She was most recently diagnosed with acute left upper extremity DVT. We are going to continue treatment with Eliquis for that. Patient was too sick yesterday to receive her chemotherapy. This is noted.  7. Acute left upper extremity deep venous  thrombosis: Plan to continue Eliquis dosing as noted above.  DVT prophylaxis: Anticoagulated Code Status: Full code Family Communication: Spoke with patient, her husband, and 2 daughters who were present in the room on admission. Disposition Plan: 3-4 days she will likely be discharged home. Consults called: None Admission status: Inpatient  I spent 55 minutes on care and coordination for this patient   Lady Deutscher MD, FACP [facility] Pager [phone] If 7PM-7AM, please contact night-coverage [email or web address] Password [email or web address]  11/02/2016, 1:47 PM

## 2016-11-02 NOTE — Progress Notes (Signed)
Pharmacy Antibiotic Note  Rita Frazier is a 41 y.o. female admitted on 11/02/2016 with sepsis and Febrile Neutropenia.  Pharmacy has been consulted for Vancomycin and Fortaz dosing. Noted 4 day hx sore throat, 2 days malaise/fatigue, fever to 103 on admit 7/28, productive cough Hx of Breast Ca, lung mets; chemo [facility], unable to receive 7/27 - low Plt (last chemo 7/20 Taxol/Herc/Pert) - WBC 1.0, ANC = 0  Plan: Continue Vancomycin 1gm IV q8h Change Zosyn to [employer] 2gm IV q8h Diflucan per MD Monitor renal function and cx data  Check Vancomycin trough at steady state   Height: 5\' 10"  (177.8 cm) Weight: 229 lb 4.5 oz (104 kg) IBW/kg (Calculated) : 68.5  Temp (24hrs), Avg:100.4 F (38 C), Min:98.9 F (37.2 C), Max:103.2 F (39.6 C)   Recent Labs Lab 11/02/16 0842 11/02/16 0847 11/02/16 1119  WBC 1.0*  --   --   CREATININE 0.73  --   --   LATICACIDVEN  --  1.39 1.62    Estimated Creatinine Clearance: 124.5 mL/min (by C-G formula based on SCr of 0.73 mg/dL).    No Known Allergies  Antimicrobials this admission: 7/28 Vancomycin >>  7/28 Zosyn x1 dose in ED 7/28 Fortaz>> 7/28 Diflucan>>  Dose adjustments this admission:  Microbiology results: 7/28 BCx: collected 7/28 Rapid Strep: pending 7/28 Group A strep: pending  Thank you for allowing pharmacy to be a part of this patient's care.  Netta Cedars, PharmD, BCPS Pager: [phone] 11/02/2016, 3:54 PM

## 2016-11-02 NOTE — ED Provider Notes (Signed)
[facility] DEPT Provider Note   CSN: [id number] Arrival date & time: 11/02/16  0802     History   Chief Complaint Chief Complaint  Patient presents with  . Fever  . Cancer Pt  . Tachycardia    HPI Rita Frazier is a 41 y.o. female who presents emergency department for fever. She has metastatic breast cancer and is currently under chemotherapy treatment with [facility]. The patient states that about 4 days ago she started having a sore throat. This has been increasingly worsening over the past several days to the point where she is unable to easily swallow and has not been eating or drinking. 2 days ago she began having generalized malaise and fatigue. Yesterday the patient was supposed to have chemotherapy but her husband states that her platelet count was too low. He is unsure of her white blood cell count. This morning the patient's husband noticed that she was radiating heat. He took her temperature and saw that her fever was 103. She was brought immediately to the emergency department. She complains of cough productive of sputum, unbearable sore throat that radiates down her esophagus. She denies abdominal pain, nausea, vomiting, urinary symptoms, wounds.  HPI  Past Medical History:  Diagnosis Date  . Anemia   . Cancer ([place])    breast  . Diabetes mellitus, type 2 [facility])     Patient Active Problem List   Diagnosis Date Noted  . Routine gynecological examination 07/02/2012  . Excessive or frequent menstruation 07/02/2012  . Vaginitis and vulvovaginitis, unspecified 07/02/2012  . Excessive or frequent menstruation 07/02/2012  . Screening for malignant neoplasm of the cervix 07/02/2012    Past Surgical History:  Procedure Laterality Date  . ANKLE SURGERY    . BREAST LUMPECTOMY    . FRACTURE SURGERY    . PORTACATH PLACEMENT      OB History    No data available       Home Medications    Prior to Admission medications   Medication Sig Start Date End Date  Taking? Authorizing Provider  ELIQUIS 5 MG TABS tablet Take 5 mg by mouth 2 (two) times daily. 10/21/16  Yes [provider]  HYDROmorphone (DILAUDID) 4 MG tablet Take 4 mg by mouth every 6 (six) hours as needed. 10/29/16  Yes [provider]  ondansetron (ZOFRAN-ODT) 8 MG disintegrating tablet Take 8 mg by mouth every 8 (eight) hours as needed. 11/01/16  Yes [provider]  metFORMIN (GLUCOPHAGE-XR) 500 MG 24 hr tablet Take 500 mg by mouth daily.    [provider]    Family History Family History  Problem Relation Age of Onset  . Cancer Mother   . Cancer Sister     Social History Social History  Substance Use Topics  . Smoking status: Never Smoker  . Smokeless tobacco: Never Used  . Alcohol use No     Allergies   Patient has no known allergies.   Review of Systems Review of Systems Ten systems reviewed and are negative for acute change, except as noted in the HPI.    Physical Exam Updated Vital Signs BP 110/74 (BP Location: Right Arm)   Pulse (!) 135   Temp 98.9 F (37.2 C) (Oral)   Resp (!) 22   Ht 5\' 10"  (1.778 m)   Wt 98.4 kg (217 lb)   SpO2 97%   BMI 31.14 kg/m   Physical Exam  Constitutional: She is oriented to person, place, and time. She appears  well-developed and well-nourished. No distress.  Patient appears ill  HENT:  Head: Normocephalic and atraumatic.  Right Ear: External ear normal.  Left Ear: External ear normal.  Mouth/Throat: Uvula is midline. Oropharyngeal exudate and posterior oropharyngeal erythema present.  Erythema, thick white plaques covering the palate, posterior oropharynx and posterior tongue consistent with candidal esophagitis  Eyes: Pupils are equal, round, and reactive to light. Conjunctivae and EOM are normal. Scleral icterus is present.  Neck: Normal range of motion. Neck supple. No JVD present. No thyromegaly present.  Cardiovascular: Regular rhythm, normal heart sounds and intact distal  pulses.  Tachycardia present.  Exam reveals no gallop and no friction rub.   No murmur heard. Pulmonary/Chest: Breath sounds normal. She has no decreased breath sounds. She has no wheezes. She has no rhonchi. She has no rales.  Decreased volume  Abdominal: Soft. Bowel sounds are normal. She exhibits no distension and no mass. There is no tenderness. There is no guarding.  Musculoskeletal: Normal range of motion. She exhibits no edema or tenderness.  No meningismus  Neurological: She is alert and oriented to person, place, and time. She has normal reflexes. No cranial nerve deficit. Coordination normal.  Skin: Skin is warm and dry. Capillary refill takes less than 2 seconds. No rash noted. She is not diaphoretic.  Hot to touch  Nursing note and vitals reviewed.    ED Treatments / Results  Labs (all labs ordered are listed, but only abnormal results are displayed) Labs Reviewed  COMPREHENSIVE METABOLIC PANEL - Abnormal; Notable for the following:       Result Value   Potassium 3.2 (*)    Glucose, Bld 162 (*)    Calcium 8.2 (*)    Albumin 2.6 (*)    AST 60 (*)    Alkaline Phosphatase 192 (*)    Total Bilirubin 3.9 (*)    All other components within normal limits  CBC WITH DIFFERENTIAL/PLATELET - Abnormal; Notable for the following:    WBC 1.0 (*)    RBC 3.57 (*)    Hemoglobin 10.5 (*)    HCT 31.2 (*)    Platelets 136 (*)    Neutro Abs 0.0 (*)    All other components within normal limits  PROTIME-INR - Abnormal; Notable for the following:    Prothrombin Time 28.7 (*)    All other components within normal limits  URINALYSIS, ROUTINE W REFLEX MICROSCOPIC - Abnormal; Notable for the following:    APPearance HAZY (*)    Specific Gravity, Urine 1.004 (*)    Hgb urine dipstick MODERATE (*)    Ketones, ur 5 (*)    Bacteria, UA RARE (*)    Squamous Epithelial / LPF 6-30 (*)    Non Squamous Epithelial 0-5 (*)    All other components within normal limits  CBG MONITORING, ED -  Abnormal; Notable for the following:    Glucose-Capillary 146 (*)    All other components within normal limits  RAPID STREP SCREEN (NOT AT [facility])  CULTURE, BLOOD (ROUTINE X 2)  CULTURE, BLOOD (ROUTINE X 2)  CULTURE, GROUP A STREP ([place])  I-STAT CG4 LACTIC ACID, ED  I-STAT CG4 LACTIC ACID, ED    EKG  EKG Interpretation None       Radiology Dg Chest 2 View  Result Date: 11/02/2016 CLINICAL DATA:  Sepsis.  Tachycardia.  Fever. EXAM: CHEST  2 VIEW COMPARISON:  09/08/2016 FINDINGS: Normal heart size. New bilateral pleural effusions, left greater than right. Mild diffuse Innumerable pulmonary nodules  are identified in both lungs. Mild diffuse superimposed pulmonary edema noted. IMPRESSION: 1. Diffuse pulmonary metastasis as before. 2. Bilateral pleural effusions and mild interstitial edema Electronically Signed   By: Kerby Moors M.D.   On: 11/02/2016 09:17    Procedures Procedures (including critical care time)  Medications Ordered in ED Medications  vancomycin (VANCOCIN) IVPB 1000 mg/200 mL premix (1,000 mg Intravenous New Bag/Given 11/02/16 0958)  magic mouthwash w/lidocaine (5 mLs Oral Given 11/02/16 0914)  piperacillin-tazobactam (ZOSYN) IVPB 3.375 g (not administered)  vancomycin (VANCOCIN) IVPB 1000 mg/200 mL premix (not administered)  vancomycin (VANCOCIN) IVPB 1000 mg/200 mL premix (not administered)  acetaminophen (TYLENOL) tablet 650 mg (650 mg Oral Given 11/02/16 0827)  piperacillin-tazobactam (ZOSYN) IVPB 3.375 g (0 g Intravenous Stopped 11/02/16 0952)  sodium chloride 0.9 % bolus 3,000 mL (3,000 mLs Intravenous New Bag/Given 11/02/16 0914)     Initial Impression / Assessment and Plan / ED Course  I have reviewed the triage vital signs and the nursing notes.  Pertinent labs & imaging results that were available during my care of the patient were reviewed by me and considered in my medical decision making (see chart for details).  Clinical Course as of Nov 03 1050  Sat  Nov 02, 2016  1610 Neutropenic precautions and code sepsis initiated at initial evaluation.  Vanc/zosyn for unknown source  [AH]  1009 WBC: (!!) 1.0 [AH]  1026 Patient fever resolved to 98.9. She continues to be tachycardid to 130. She is feeling imp0roved.   [AH]  1045 Patient reports an episode of hemoptysis. She is on Eliquis for a LUE DVT. She has had it for a few weeks. The patient could potentially have hemoptysis secondary to bleeding from one of the metastases in her body, however. Her risk factors for pulmonary embolus are markedly high.  [AH]    Clinical Course User Index [AH] Margarita Mail, PA-C   Patient being treated for sepsis. She is neutropenic. She'll be admitted to the stepdown unit by the hospitalist. She saving fluids. Question fungemia. Given the only apparent source is her esophageal candidiasis. She is improving with treatment. She appears safe for admission at this time. Final Clinical Impressions(s) / ED Diagnoses   Final diagnoses:  Sepsis, due to unspecified organism ([place])  Candidiasis, esophageal ([facility])  Pleural effusion, bilateral    New Prescriptions New Prescriptions   No medications on file     Margarita Mail, PA-C 11/02/16 1649    Virgel Manifold, MD 11/10/16 [phone]

## 2016-11-02 NOTE — ED Notes (Signed)
Patient ambulated to restroom with standby assist.  

## 2016-11-02 NOTE — ED Notes (Signed)
ED TO INPATIENT HANDOFF REPORT  Name/Age/Gender Rita Nutting Y Frazier 41 y.o. female  Home/SNF/Other Home  Chief Complaint fever  Code Status History    This patient does not have a recorded code status. Please follow your organizational policy for patients in this situation.      Level of Care/Admitting Diagnosis ED Disposition    ED Disposition Condition Comment   Admit  Hospital Area: [facility] [[id number]]  Level of Care: Telemetry [5]  Admit to tele based on following criteria: Other see comments  Comments: hypokalemia, sepsis  Diagnosis: Sepsis [facility]) [[id number]]  Admitting Physician: Lady Deutscher [[id number]]  Attending Physician: Lady Deutscher [[id number]]  Estimated length of stay: 3 - 4 days  Certification:: I certify this patient will need inpatient services for at least 2 midnights  PT Class (Do Not Modify): Inpatient [101]  PT Acc Code (Do Not Modify): Private [1]       Medical History Past Medical History:  Diagnosis Date  . Anemia   . Cancer ([place])    breast  . Diabetes mellitus, type 2 ([place])     Allergies No Known Allergies  IV Location/Drains/Wounds Patient Lines/Drains/Airways Status   Active Line/Drains/Airways    Name:   Placement date:   Placement time:   Site:   Days:   Peripheral IV 11/02/16 Right Hand  11/02/16    0845    Hand    less than 1   Peripheral IV 11/02/16 Right Antecubital  11/02/16    1112    Antecubital    less than 1          Labs/Imaging Results for orders placed or performed during the hospital encounter of 11/02/16 (from the past 48 hour(s))  Rapid strep screen (not at [facility])     Status: None   Collection Time: 11/02/16  8:32 AM  Result Value Ref Range   Streptococcus, Group A Screen (Direct) NEGATIVE NEGATIVE    Comment: (NOTE) A Rapid Antigen test may result negative if the antigen level in the sample is below the detection level of this test. The FDA has not cleared this test as a stand-alone test  therefore the rapid antigen negative result has reflexed to a Group A Strep culture.   Comprehensive metabolic panel     Status: Abnormal   Collection Time: 11/02/16  8:42 AM  Result Value Ref Range   Sodium 138 135 - 145 mmol/L   Potassium 3.2 (L) 3.5 - 5.1 mmol/L   Chloride 106 101 - 111 mmol/L   CO2 22 22 - 32 mmol/L   Glucose, Bld 162 (H) 65 - 99 mg/dL   BUN 7 6 - 20 mg/dL   Creatinine, Ser 0.73 0.44 - 1.00 mg/dL   Calcium 8.2 (L) 8.9 - 10.3 mg/dL   Total Protein 7.0 6.5 - 8.1 g/dL   Albumin 2.6 (L) 3.5 - 5.0 g/dL   AST 60 (H) 15 - 41 U/L   ALT 34 14 - 54 U/L   Alkaline Phosphatase 192 (H) 38 - 126 U/L   Total Bilirubin 3.9 (H) 0.3 - 1.2 mg/dL   GFR calc non Af Amer >60 >60 mL/min   GFR calc Af Amer >60 >60 mL/min    Comment: (NOTE) The eGFR has been calculated using the CKD EPI equation. This calculation has not been validated in all clinical situations. eGFR's persistently <60 mL/min signify possible Chronic Kidney Disease.    Anion gap 10 5 - 15  CBC with Differential  Status: Abnormal   Collection Time: 11/02/16  8:42 AM  Result Value Ref Range   WBC 1.0 (LL) 4.0 - 10.5 K/uL    Comment: RESULT REPEATED AND VERIFIED CRITICAL RESULT CALLED TO, READ BACK BY AND VERIFIED WITH: DAVEY,A AT 0945 ON [id number] BY HOOKER,B    RBC 3.57 (L) 3.87 - 5.11 MIL/uL   Hemoglobin 10.5 (L) 12.0 - 15.0 g/dL   HCT 31.2 (L) 36.0 - 46.0 %   MCV 87.4 78.0 - 100.0 fL   MCH 29.4 26.0 - 34.0 pg   MCHC 33.7 30.0 - 36.0 g/dL   RDW 15.1 11.5 - 15.5 %   Platelets 136 (L) 150 - 400 K/uL   Neutrophils Relative % 2 %   Lymphocytes Relative 78 %   Monocytes Relative 20 %   Eosinophils Relative 0 %   Basophils Relative 0 %   Neutro Abs 0.0 (L) 1.7 - 7.7 K/uL   Lymphs Abs 0.8 0.7 - 4.0 K/uL   Monocytes Absolute 0.2 0.1 - 1.0 K/uL   Eosinophils Absolute 0.0 0.0 - 0.7 K/uL   Basophils Absolute 0.0 0.0 - 0.1 K/uL   RBC Morphology RARE NRBCs    WBC Morphology ATYPICAL LYMPHOCYTES   Protime-INR      Status: Abnormal   Collection Time: 11/02/16  8:42 AM  Result Value Ref Range   Prothrombin Time 28.7 (H) 11.4 - 15.2 seconds   INR 2.64   I-Stat CG4 Lactic Acid, ED     Status: None   Collection Time: 11/02/16  8:47 AM  Result Value Ref Range   Lactic Acid, Venous 1.39 0.5 - 1.9 mmol/L  Fungus culture, blood     Status: None (Preliminary result)   Collection Time: 11/02/16  9:06 AM  Result Value Ref Range   Specimen Description      BLOOD RIGHT HAND Performed at [facility], [address] [place], [place] [address]    Special Requests Immunocompromised    Culture PENDING    Report Status PENDING   CBG monitoring, ED     Status: Abnormal   Collection Time: 11/02/16  9:12 AM  Result Value Ref Range   Glucose-Capillary 146 (H) 65 - 99 mg/dL  Urinalysis, Routine w reflex microscopic     Status: Abnormal   Collection Time: 11/02/16  9:57 AM  Result Value Ref Range   Color, Urine YELLOW YELLOW   APPearance HAZY (A) CLEAR   Specific Gravity, Urine 1.004 (L) 1.005 - 1.030   pH 6.0 5.0 - 8.0   Glucose, UA NEGATIVE NEGATIVE mg/dL   Hgb urine dipstick MODERATE (A) NEGATIVE   Bilirubin Urine NEGATIVE NEGATIVE   Ketones, ur 5 (A) NEGATIVE mg/dL   Protein, ur NEGATIVE NEGATIVE mg/dL   Nitrite NEGATIVE NEGATIVE   Leukocytes, UA NEGATIVE NEGATIVE   RBC / HPF 0-5 0 - 5 RBC/hpf   WBC, UA 0-5 0 - 5 WBC/hpf   Bacteria, UA RARE (A) NONE SEEN   Squamous Epithelial / LPF 6-30 (A) NONE SEEN   Non Squamous Epithelial 0-5 (A) NONE SEEN  I-Stat CG4 Lactic Acid, ED  (not at  [facility])     Status: None   Collection Time: 11/02/16 11:19 AM  Result Value Ref Range   Lactic Acid, Venous 1.62 0.5 - 1.9 mmol/L   Dg Chest 2 View  Result Date: 11/02/2016 CLINICAL DATA:  Sepsis.  Tachycardia.  Fever. EXAM: CHEST  2 VIEW COMPARISON:  09/08/2016 FINDINGS: Normal heart size. New bilateral pleural  effusions, left greater than right. Mild diffuse Innumerable pulmonary nodules are identified in both  lungs. Mild diffuse superimposed pulmonary edema noted. IMPRESSION: 1. Diffuse pulmonary metastasis as before. 2. Bilateral pleural effusions and mild interstitial edema Electronically Signed   By: Kerby Moors M.D.   On: 11/02/2016 09:17   Ct Angio Chest Pe W And/or Wo Contrast  Result Date: 11/02/2016 CLINICAL DATA:  Tachycardia, metastatic cancer, hemoptysis, left upper extremity DVT. A true pain fever. Tachycardia. EXAM: CT ANGIOGRAPHY CHEST WITH CONTRAST TECHNIQUE: Multidetector CT imaging of the chest was performed using the standard protocol during bolus administration of intravenous contrast. Multiplanar CT image reconstructions and MIPs were obtained to evaluate the vascular anatomy. CONTRAST:  100 cc Isovue 370. COMPARISON:  PET 09/24/2016 and CT chest 09/08/2016. FINDINGS: Cardiovascular: Image quality is degraded by suboptimal opacification of the segmental and subsegmental pulmonary arteries as well as respiratory motion. No central or lobar pulmonary embolus. Vascular structures are unremarkable. Heart is at the upper limits of normal in size to mildly enlarged. No pericardial effusion. Mediastinum/Nodes: Right lobe of the thyroid is enlarged and contains a heterogeneous nodule measuring 2.9 cm. There is slight leftward deviation of the trachea as well. No pathologically enlarged mediastinal, hilar or axillary lymph nodes. Surgical clips in both axillary regions. Esophagus maybe slightly thickened. Lungs/Pleura: Hematogenously distributed pulmonary nodules are seen throughout the lungs bilaterally. Index nodule in the medial right lower lobe measures 11 mm (series 11, image 46), previously 10 mm. Scattered septal thickening is seen bilaterally, as before. Bilateral pleural effusions, moderate on the right and small on the left. Compressive atelectasis in both lower lobes. Airway is unremarkable. Upper Abdomen: Multiple ill-defined mildly hypoattenuating lesions are seen in the liver, measuring up  to approximately 3.5 cm centrally, as before. Visualized portions of the adrenal glands, spleen and stomach are grossly unremarkable. Musculoskeletal: Sclerotic lesions are seen in the right humeral head and lower thoracic spine. Review of the MIP images confirms the above findings. IMPRESSION: 1. Segmental and subsegmental pulmonary arteries are poorly opacified, limiting evaluation. No central or lobar pulmonary embolus. 2. Pulmonary, hepatic and osseous metastatic disease, grossly stable. 3. Bilateral pleural effusions, right greater than left. 4. Right thyroid nodule, indeterminate. Electronically Signed   By: Lorin Picket M.D.   On: 11/02/2016 11:55    Pending Labs Unresulted Labs    Start     Ordered   11/02/16 1330  Urine Culture  Add-on,   R    Question:  Patient immune status  Answer:  Immunocompromised   11/02/16 1329   11/02/16 0832  Culture, group A strep  Once,   STAT     11/02/16 0832   11/02/16 0818  Culture, blood (Routine x 2)  BLOOD CULTURE X 2,   STAT     11/02/16 0817   Signed and Held  HIV antibody (Routine Testing)  Once,   R     Signed and Held   Signed and Held  CBC with Differential  Tomorrow morning,   R    Comments:  ANC please    Signed and Held   Signed and Held  Protime-INR  STAT,   R     Signed and Held   Signed and Held  APTT  STAT,   R     Signed and Held   Signed and Held  Comprehensive metabolic panel  Tomorrow morning,   R     Signed and Held   Signed and Held  Hemoglobin A1c  Once,   R    Comments:  To assess prior glycemic control    Signed and Held   Signed and Held  Type and screen [place]  STAT,   R    Comments:  [place]    Signed and Held   Signed and Held  Lactic acid, plasma  STAT Now then every 3 hours,   STAT     Signed and Held      Isolation Precautions Protective Precautions  Vitals/Pain Today's Vitals   11/02/16 1116 11/02/16 1204 11/02/16 1306 11/02/16 1351  BP:  118/84 (!)  144/94   Pulse:  (!) 131 (!) 131   Resp:  17 (!) 23   Temp:      TempSrc:      SpO2:  91% 96%   Weight:      Height:      PainSc: 4    6     Medications Medications  magic mouthwash w/lidocaine (5 mLs Oral Given 11/02/16 1308)  piperacillin-tazobactam (ZOSYN) IVPB 3.375 g (not administered)  vancomycin (VANCOCIN) IVPB 1000 mg/200 mL premix (not administered)  iopamidol (ISOVUE-370) 76 % injection (not administered)  fluconazole (DIFLUCAN) IVPB 200 mg (200 mg Intravenous New Bag/Given 11/02/16 1309)  HYDROmorphone (DILAUDID) tablet 4 mg (4 mg Oral Given 11/02/16 1351)  acetaminophen (TYLENOL) tablet 650 mg (650 mg Oral Given 11/02/16 0827)  piperacillin-tazobactam (ZOSYN) IVPB 3.375 g (0 g Intravenous Stopped 11/02/16 0952)  vancomycin (VANCOCIN) IVPB 1000 mg/200 mL premix (0 mg Intravenous Stopped 11/02/16 1109)  sodium chloride 0.9 % bolus 3,000 mL (0 mLs Intravenous Stopped 11/02/16 1307)  vancomycin (VANCOCIN) IVPB 1000 mg/200 mL premix (0 mg Intravenous Stopped 11/02/16 1157)  iopamidol (ISOVUE-370) 76 % injection 100 mL (100 mLs Intravenous Contrast Given 11/02/16 1133)  ondansetron (ZOFRAN) injection 4 mg (4 mg Intravenous Given 11/02/16 1153)    Mobility walks

## 2016-11-02 NOTE — ED Provider Notes (Signed)
Medical screening examination/treatment/procedure(s) were conducted as a shared visit with non-physician practitioner(s) and myself.  I personally evaluated the patient during the encounter.   EKG Interpretation None      40yF with SIRS. Hx of metastatic CA getting chemo. ANC yesterday 0.0. Severe esophagitis on exam. Febrile. Tachy to 150s. Not eating. Empiric abx for SIRS/neutropenic fever. IVF. W/u for source. Admit.    Virgel Manifold, MD 11/02/16 [phone]

## 2016-11-03 ENCOUNTER — Inpatient Hospital Stay (HOSPITAL_COMMUNITY): Payer: 59

## 2016-11-03 LAB — CBC WITH DIFFERENTIAL/PLATELET
BASOS ABS: 0 10*3/uL (ref 0.0–0.1)
BASOS PCT: 1 %
EOS ABS: 0 10*3/uL (ref 0.0–0.7)
Eosinophils Relative: 0 %
HCT: 28 % — ABNORMAL LOW (ref 36.0–46.0)
Hemoglobin: 9.2 g/dL — ABNORMAL LOW (ref 12.0–15.0)
LYMPHS PCT: 34 %
Lymphs Abs: 0.8 10*3/uL (ref 0.7–4.0)
MCH: 29.1 pg (ref 26.0–34.0)
MCHC: 32.9 g/dL (ref 30.0–36.0)
MCV: 88.6 fL (ref 78.0–100.0)
Monocytes Absolute: 1.4 10*3/uL — ABNORMAL HIGH (ref 0.1–1.0)
Monocytes Relative: 57 %
NEUTROS PCT: 8 %
Neutro Abs: 0.2 10*3/uL — ABNORMAL LOW (ref 1.7–7.7)
PLATELETS: 168 10*3/uL (ref 150–400)
RBC: 3.16 MIL/uL — ABNORMAL LOW (ref 3.87–5.11)
RDW: 15.1 % (ref 11.5–15.5)
WBC: 2.4 10*3/uL — ABNORMAL LOW (ref 4.0–10.5)

## 2016-11-03 LAB — GLUCOSE, CAPILLARY
Glucose-Capillary: 103 mg/dL — ABNORMAL HIGH (ref 65–99)
Glucose-Capillary: 170 mg/dL — ABNORMAL HIGH (ref 65–99)
Glucose-Capillary: 126 mg/dL — ABNORMAL HIGH (ref 65–99)
Glucose-Capillary: 124 mg/dL — ABNORMAL HIGH (ref 65–99)

## 2016-11-03 LAB — HEMOGLOBIN A1C
HEMOGLOBIN A1C: 5.6 % (ref 4.8–5.6)
Mean Plasma Glucose: 114 mg/dL

## 2016-11-03 LAB — URINE CULTURE

## 2016-11-03 LAB — COMPREHENSIVE METABOLIC PANEL
ALT: 29 U/L (ref 14–54)
AST: 48 U/L — AB (ref 15–41)
Albumin: 2.2 g/dL — ABNORMAL LOW (ref 3.5–5.0)
Alkaline Phosphatase: 156 U/L — ABNORMAL HIGH (ref 38–126)
Anion gap: 8 (ref 5–15)
BUN: 6 mg/dL (ref 6–20)
CHLORIDE: 113 mmol/L — AB (ref 101–111)
CO2: 23 mmol/L (ref 22–32)
CREATININE: 0.75 mg/dL (ref 0.44–1.00)
Calcium: 8.1 mg/dL — ABNORMAL LOW (ref 8.9–10.3)
GFR calc Af Amer: >60 mL/min (ref >60)
GFR calc non Af Amer: >60 mL/min (ref >60)
GLUCOSE: 102 mg/dL — AB (ref 65–99)
Potassium: 3.6 mmol/L (ref 3.5–5.1)
SODIUM: 144 mmol/L (ref 135–145)
Total Bilirubin: 2.7 mg/dL — ABNORMAL HIGH (ref 0.3–1.2)
Total Protein: 6.5 g/dL (ref 6.5–8.1)

## 2016-11-03 LAB — VANCOMYCIN, TROUGH: Vancomycin Tr: 11 ug/mL — ABNORMAL LOW (ref 15–20)

## 2016-11-03 LAB — HIV ANTIBODY (ROUTINE TESTING W REFLEX): HIV Screen 4th Generation wRfx: NONREACTIVE

## 2016-11-03 MED ORDER — PROMETHAZINE HCL 25 MG/ML IJ SOLN
12.5000 mg | Freq: Four times a day (QID) | INTRAMUSCULAR | Status: DC | PRN
  Administered 2016-11-03 – 2016-11-04 (×2): 12.5 mg via INTRAVENOUS
  Filled 2016-11-03 (×3): qty 1

## 2016-11-03 MED ORDER — PROMETHAZINE HCL 25 MG/ML IJ SOLN
12.5000 mg | Freq: Once | INTRAMUSCULAR | Status: AC
  Administered 2016-11-03: 12.5 mg via INTRAVENOUS
  Filled 2016-11-03: qty 1

## 2016-11-03 MED ORDER — INSULIN ASPART 100 UNIT/ML ~~LOC~~ SOLN
3.0000 [IU] | Freq: Three times a day (TID) | SUBCUTANEOUS | Status: DC
  Administered 2016-11-04 – 2016-11-06 (×5): 3 [IU] via SUBCUTANEOUS

## 2016-11-03 MED ORDER — DIPHENHYDRAMINE HCL 50 MG/ML IJ SOLN
25.0000 mg | Freq: Once | INTRAMUSCULAR | Status: AC
  Administered 2016-11-03: 25 mg via INTRAVENOUS
  Filled 2016-11-03: qty 1

## 2016-11-03 MED ORDER — INSULIN ASPART 100 UNIT/ML ~~LOC~~ SOLN: 0.0000 [IU] | Freq: Every day | SUBCUTANEOUS | Status: DC

## 2016-11-03 MED ORDER — INSULIN ASPART 100 UNIT/ML ~~LOC~~ SOLN
0.0000 [IU] | Freq: Three times a day (TID) | SUBCUTANEOUS | Status: DC
  Administered 2016-11-03: 2 [IU] via SUBCUTANEOUS

## 2016-11-03 MED ORDER — LORAZEPAM 2 MG/ML IJ SOLN
1.0000 mg | Freq: Once | INTRAMUSCULAR | Status: AC
  Administered 2016-11-03: 1 mg via INTRAVENOUS
  Filled 2016-11-03: qty 1

## 2016-11-03 MED ORDER — VANCOMYCIN HCL 10 G IV SOLR
1250.0000 mg | Freq: Three times a day (TID) | INTRAVENOUS | Status: DC
  Administered 2016-11-04 (×2): 1250 mg via INTRAVENOUS
  Filled 2016-11-03 (×3): qty 1250

## 2016-11-03 NOTE — Progress Notes (Signed)
Pharmacy Antibiotic Note  Rita Frazier is a 41 y.o. female admitted on 11/02/2016 with sepsis and Febrile Neutropenia.  Pharmacy has been consulted for Vancomycin and Fortaz dosing.   7/29 VT = 11, below goal range 15-20  Plan: Increase to Vancomycin 1250 mg IV q8h  Height: 5\' 10"  (177.8 cm) Weight: 229 lb 4.5 oz (104 kg) IBW/kg (Calculated) : 68.5  Temp (24hrs), Avg:99 F (37.2 C), Min:98.9 F (37.2 C), Max:99 F (37.2 C)   Recent Labs Lab 11/02/16 0842 11/02/16 0847 11/02/16 1119 11/02/16 1605 11/02/16 2005 11/03/16 0519  WBC 1.0*  --   --   --   --  2.4*  CREATININE 0.73  --   --   --   --  0.75  LATICACIDVEN  --  1.39 1.62 1.9 1.7  --     Estimated Creatinine Clearance: 124.5 mL/min (by C-G formula based on SCr of 0.75 mg/dL).    No Known Allergies  Thank you for allowing pharmacy to be a part of this patient's care.  Gretta Arab PharmD, BCPS Pager [phone] 11/03/2016 8:01 PM

## 2016-11-03 NOTE — Progress Notes (Signed)
[facility]                                                                              Patient Demographics  Rita Frazier, is a 41 y.o. female, DOB - [birth year], QVZ:[id number]  Admit date - 11/02/2016   Admitting Physician Lady Deutscher, MD  Outpatient Primary MD for the patient is Patient, No Pcp Per  Outpatient specialists:  Dustin Folks, MD at [facility] (oncologist  LOS - 1  days    Chief Complaint  Patient presents with  . Fever  . Cancer Pt  . Tachycardia       Brief summary  Rita Frazier is a 41 y.o. 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 She was tachycardic and tachypneic, and admitted for febrile neutropenia with sepsis  Assessment & Plan    Principal Problem:   Sepsis ([place]) Active Problems:   Neutropenia with fever (H[facility])   Hypokalemia   Primary breast cancer with metastasis to other site [facility])   Pharyngitis, acute   Acute esophagitis   Acute deep vein thrombosis (DVT) of axillary vein of left upper extremity ([place]) #1 sepsis: Source unclear- ? pharynx IV fluids IV antibiotics Follow-up cultures-blood, urine  #2 febrile neutropenia: Continue antibiotics Monitor absolute neutrophil count  #3 acute esophagitis: Continue Diflucan Supportive care  #4 acute pharyngitis: Antibiotics Supportive care Follow-up culture   #5 protein calorie malnutrition: Due to malignancy Check prealbumin level Nutrition consult  #6 nausea and vomiting: Secondary to sepsis Supportive care with antinauseants/antiemetics Check a KUB  #7 hypokalemia: Due to GI loss Resolved  #  8 pleural effusion: Likely related to metastatic disease Bilateral, with mild edema Check BNP  #9 DM 2: Sliding-scale insulin Hold metformin for now - resume when nausea and vomiting controlled  Code Status: Full code DVT Prophylaxis:  Eliquis Family Communication: Patient and family updated at bedside  Disposition Plan: Home  Time Spent in minutes   35 minutes  Procedures:    Consultants:     Antimicrobials:      Medications  Scheduled Meds: . apixaban  5 mg Oral BID  . insulin aspart  0-15 Units Subcutaneous TID WC  . insulin aspart  4 Units Subcutaneous TID WC  . magic mouthwash w/lidocaine  5 mL Oral Q4H  . sodium chloride flush  3 mL Intravenous Q12H   Continuous Infusions: . 0.9 % NaCl with KCl 40 mEq / L 125 mL/hr (11/03/16 0233)  . cefTAZidime (FORTAZ)  IV Stopped (11/03/16 0544)  . fluconazole (DIFLUCAN) IV Stopped (11/02/16 1409)  . vancomycin 1,000 mg (11/03/16 0928)   PRN Meds:.acetaminophen **OR** acetaminophen, bisacodyl, HYDROmorphone (DILAUDID) injection, ketorolac, magnesium citrate, ondansetron (ZOFRAN) IV, senna-docusate, zolpidem   Antibiotics   Anti-infectives    Start     Dose/Rate Route Frequency Ordered Stop   11/02/16  1800  vancomycin (VANCOCIN) IVPB 1000 mg/200 mL premix     1,000 mg 200 mL/hr over 60 Minutes Intravenous Every 8 hours 11/02/16 1013     11/02/16 1700  piperacillin-tazobactam (ZOSYN) IVPB 3.375 g  Status:  Discontinued     3.375 g 12.5 mL/hr over 240 Minutes Intravenous Every 8 hours 11/02/16 0917 11/02/16 1548   11/02/16 1600  cefTAZidime (FORTAZ) 1 g in dextrose 5 % 50 mL IVPB  Status:  Discontinued     1 g 100 mL/hr over 30 Minutes Intravenous Every 8 hours 11/02/16 1548 11/02/16 1553   11/02/16 1600  cefTAZidime (FORTAZ) 2 g in dextrose 5 % 50 mL IVPB     2 g 100 mL/hr over 30 Minutes Intravenous Every 8 hours 11/02/16 1553     11/02/16 1300  fluconazole (DIFLUCAN) IVPB 200 mg     200 mg 100 mL/hr over 60  Minutes Intravenous Every 24 hours 11/02/16 1220     11/02/16 1015  vancomycin (VANCOCIN) IVPB 1000 mg/200 mL premix     1,000 mg 200 mL/hr over 60 Minutes Intravenous  Once 11/02/16 1004 11/02/16 2320   11/02/16 0830  piperacillin-tazobactam (ZOSYN) IVPB 3.375 g     3.375 g 100 mL/hr over 30 Minutes Intravenous  Once 11/02/16 0829 11/02/16 2319   11/02/16 0830  vancomycin (VANCOCIN) IVPB 1000 mg/200 mL premix     1,000 mg 200 mL/hr over 60 Minutes Intravenous  Once 11/02/16 0829 11/02/16 2320        Subjective:   Rita Frazier was seen and examined today. H& P reviewed. Episodes of N/V overnight controlled with zofran/phenergan. Neutropenia improved some  Objective:   Vitals:   11/02/16 1535 11/02/16 1548 11/02/16 2119 11/03/16 0500  BP: 132/84  129/85 132/75  Pulse: (!) 129  (!) 132 (!) 136  Resp: 17  18 20   Temp: 99.1 F (37.3 C)  99 F (37.2 C) 98.9 F (37.2 C)  TempSrc: Oral  Oral Oral  SpO2: 100%  96% 95%  Weight:  104 kg (229 lb 4.5 oz)    Height:  5\' 10"  (1.778 m)      Intake/Output Summary (Last 24 hours) at 11/03/16 1135 Last data filed at 11/03/16 0234  Gross per 24 hour  Intake          5837.78 ml  Output                0 ml  Net          5837.78 ml     Wt Readings from Last 3 Encounters:  11/02/16 104 kg (229 lb 4.5 oz)  02/20/14 117.9 kg (260 lb)  08/11/12 114.8 kg (253 lb)     Exam  General: NAD  HEENT: NCAT,  PERRL,MMM  Neck: SUPPLE, (-) JVD  Cardiovascular: Tachycardic, regular (-) GALLOP, (-) MURMUR  Respiratory: Bibasilar decreased breath sounds  Gastrointestinal: SOFT, (-) DISTENSION, BS(+), (_) TENDERNESS  Ext: (-) CYANOSIS, (-) EDEMA  Neuro: A, OX 3  Skin:(-) RASH  Psych:NORMAL AFFECT/MOOD   Data Reviewed:  I have personally reviewed following labs and imaging studies  Micro Results Recent Results (from the past 240 hour(s))  Rapid strep screen (not at [facility])     Status: None   Collection Time: 11/02/16  8:32 AM    Result Value Ref Range Status   Streptococcus, Group A Screen (Direct) NEGATIVE NEGATIVE Final    Comment: (NOTE) A Rapid Antigen test may result negative if the antigen level in  the sample is below the detection level of this test. The FDA has not cleared this test as a stand-alone test therefore the rapid antigen negative result has reflexed to a Group A Strep culture.   Culture, group A strep     Status: None (Preliminary result)   Collection Time: 11/02/16  8:32 AM  Result Value Ref Range Status   Specimen Description THROAT  Final   Special Requests NONE Reflexed from [id number]  Final   Culture   Final    CULTURE REINCUBATED FOR BETTER GROWTH Performed at [facility], [place] [address] [place], [place] [address]    Report Status PENDING  Incomplete  Fungus culture, blood     Status: None (Preliminary result)   Collection Time: 11/02/16  9:06 AM  Result Value Ref Range Status   Specimen Description   Final    BLOOD RIGHT HAND Performed at [facility], [place] [address] [place], [place] [address]    Special Requests Immunocompromised  Final   Culture PENDING  Incomplete   Report Status PENDING  Incomplete    Radiology Reports Dg Chest 2 View  Result Date: 11/02/2016 CLINICAL DATA:  Sepsis.  Tachycardia.  Fever. EXAM: CHEST  2 VIEW COMPARISON:  09/08/2016 FINDINGS: Normal heart size. New bilateral pleural effusions, left greater than right. Mild diffuse Innumerable pulmonary nodules are identified in both lungs. Mild diffuse superimposed pulmonary edema noted. IMPRESSION: 1. Diffuse pulmonary metastasis as before. 2. Bilateral pleural effusions and mild interstitial edema Electronically Signed   By: Kerby Moors M.D.   On: 11/02/2016 09:17   Ct Angio Chest Pe W And/or Wo Contrast  Result Date: 11/02/2016 CLINICAL DATA:  Tachycardia, metastatic cancer, hemoptysis, left upper extremity DVT. A true pain fever. Tachycardia. EXAM: CT ANGIOGRAPHY CHEST WITH CONTRAST  TECHNIQUE: Multidetector CT imaging of the chest was performed using the standard protocol during bolus administration of intravenous contrast. Multiplanar CT image reconstructions and MIPs were obtained to evaluate the vascular anatomy. CONTRAST:  100 cc Isovue 370. COMPARISON:  PET 09/24/2016 and CT chest 09/08/2016. FINDINGS: Cardiovascular: Image quality is degraded by suboptimal opacification of the segmental and subsegmental pulmonary arteries as well as respiratory motion. No central or lobar pulmonary embolus. Vascular structures are unremarkable. Heart is at the upper limits of normal in size to mildly enlarged. No pericardial effusion. Mediastinum/Nodes: Right lobe of the thyroid is enlarged and contains a heterogeneous nodule measuring 2.9 cm. There is slight leftward deviation of the trachea as well. No pathologically enlarged mediastinal, hilar or axillary lymph nodes. Surgical clips in both axillary regions. Esophagus maybe slightly thickened. Lungs/Pleura: Hematogenously distributed pulmonary nodules are seen throughout the lungs bilaterally. Index nodule in the medial right lower lobe measures 11 mm (series 11, image 46), previously 10 mm. Scattered septal thickening is seen bilaterally, as before. Bilateral pleural effusions, moderate on the right and small on the left. Compressive atelectasis in both lower lobes. Airway is unremarkable. Upper Abdomen: Multiple ill-defined mildly hypoattenuating lesions are seen in the liver, measuring up to approximately 3.5 cm centrally, as before. Visualized portions of the adrenal glands, spleen and stomach are grossly unremarkable. Musculoskeletal: Sclerotic lesions are seen in the right humeral head and lower thoracic spine. Review of the MIP images confirms the above findings. IMPRESSION: 1. Segmental and subsegmental pulmonary arteries are poorly opacified, limiting evaluation. No central or lobar pulmonary embolus. 2. Pulmonary, hepatic and osseous  metastatic disease, grossly stable. 3. Bilateral pleural effusions, right greater than left. 4.  Right thyroid nodule, indeterminate. Electronically Signed   By: Lorin Picket M.D.   On: 11/02/2016 11:55    Lab Data:  CBC:  Recent Labs Lab 11/02/16 0842 11/03/16 0519  WBC 1.0* 2.4*  NEUTROABS 0.0* 0.2*  HGB 10.5* 9.2*  HCT 31.2* 28.0*  MCV 87.4 88.6  PLT 136* 034   Basic Metabolic Panel:  Recent Labs Lab 11/02/16 0842 11/03/16 0519  NA 138 144  K 3.2* 3.6  CL 106 113*  CO2 22 23  GLUCOSE 162* 102*  BUN 7 6  CREATININE 0.73 0.75  CALCIUM 8.2* 8.1*   GFR: Estimated Creatinine Clearance: 124.5 mL/min (by C-G formula based on SCr of 0.75 mg/dL). Liver Function Tests:  Recent Labs Lab 11/02/16 0842 11/03/16 0519  AST 60* 48*  ALT 34 29  ALKPHOS 192* 156*  BILITOT 3.9* 2.7*  PROT 7.0 6.5  ALBUMIN 2.6* 2.2*   No results for input(s): LIPASE, AMYLASE in the last 168 hours. No results for input(s): AMMONIA in the last 168 hours. Coagulation Profile:  Recent Labs Lab 11/02/16 0842 11/02/16 1605  INR 2.64 2.65   Cardiac Enzymes: No results for input(s): CKTOTAL, CKMB, CKMBINDEX, TROPONINI in the last 168 hours. BNP (last 3 results) No results for input(s): PROBNP in the last 8760 hours. HbA1C: No results for input(s): HGBA1C in the last 72 hours. CBG:  Recent Labs Lab 11/02/16 0912 11/02/16 1732 11/02/16 2119 11/03/16 0719 11/03/16 1122  GLUCAP 146* 180* 126* 103* 170*   Lipid Profile: No results for input(s): CHOL, HDL, LDLCALC, TRIG, CHOLHDL, LDLDIRECT in the last 72 hours. Thyroid Function Tests: No results for input(s): TSH, T4TOTAL, FREET4, T3FREE, THYROIDAB in the last 72 hours. Anemia Panel: No results for input(s): VITAMINB12, FOLATE, FERRITIN, TIBC, IRON, RETICCTPCT in the last 72 hours. Urine analysis:    Component Value Date/Time   COLORURINE YELLOW 11/02/2016 0957   APPEARANCEUR HAZY (A) 11/02/2016 0957   LABSPEC 1.004 (L)  11/02/2016 0957   PHURINE 6.0 11/02/2016 0957   GLUCOSEU NEGATIVE 11/02/2016 0957   HGBUR MODERATE (A) 11/02/2016 0957   BILIRUBINUR NEGATIVE 11/02/2016 0957   BILIRUBINUR neg 06/30/2012 1151   KETONESUR 5 (A) 11/02/2016 0957   PROTEINUR NEGATIVE 11/02/2016 0957   UROBILINOGEN 1.0 02/20/2014 2326   NITRITE NEGATIVE 11/02/2016 0957   LEUKOCYTESUR NEGATIVE 11/02/2016 0957     OSEI-BONSU,Ryota Treece M.D. [facility] 11/03/2016, 11:35 AM  Pager: [phone] Between 7am to 7pm - call Pager - [phone]  After 7pm go to [email or web address] - password [email or web address]  Call night coverage person covering after 7pm

## 2016-11-03 NOTE — Progress Notes (Signed)
Pt has had continuous feelings of nausea during the night. Pt did vomit one time. I think alternating zofran and phenergan has helped the patient. Will continue to monitor patient and update day shift RN.  Carmela Hurt, RN

## 2016-11-04 ENCOUNTER — Inpatient Hospital Stay (HOSPITAL_COMMUNITY): Payer: 59

## 2016-11-04 DIAGNOSIS — M7989 Other specified soft tissue disorders: Secondary | ICD-10-CM

## 2016-11-04 DIAGNOSIS — M79609 Pain in unspecified limb: Secondary | ICD-10-CM

## 2016-11-04 LAB — CULTURE, GROUP A STREP (THRC)

## 2016-11-04 LAB — BASIC METABOLIC PANEL
ANION GAP: 7 (ref 5–15)
CO2: 22 mmol/L (ref 22–32)
Calcium: 8.3 mg/dL — ABNORMAL LOW (ref 8.9–10.3)
Chloride: 114 mmol/L — ABNORMAL HIGH (ref 101–111)
Creatinine, Ser: 0.72 mg/dL (ref 0.44–1.00)
GFR calc Af Amer: >60 mL/min (ref >60)
GLUCOSE: 128 mg/dL — AB (ref 65–99)
POTASSIUM: 3.7 mmol/L (ref 3.5–5.1)
SODIUM: 143 mmol/L (ref 135–145)

## 2016-11-04 LAB — CBC
HCT: 28.8 % — ABNORMAL LOW (ref 36.0–46.0)
Hemoglobin: 9.5 g/dL — ABNORMAL LOW (ref 12.0–15.0)
MCH: 29.3 pg (ref 26.0–34.0)
MCHC: 33 g/dL (ref 30.0–36.0)
MCV: 88.9 fL (ref 78.0–100.0)
PLATELETS: 212 10*3/uL (ref 150–400)
RBC: 3.24 MIL/uL — AB (ref 3.87–5.11)
RDW: 15.2 % (ref 11.5–15.5)
WBC: 6.2 10*3/uL (ref 4.0–10.5)

## 2016-11-04 LAB — GLUCOSE, CAPILLARY
GLUCOSE-CAPILLARY: 104 mg/dL — AB (ref 65–99)
GLUCOSE-CAPILLARY: 115 mg/dL — AB (ref 65–99)
Glucose-Capillary: 133 mg/dL — ABNORMAL HIGH (ref 65–99)
Glucose-Capillary: 113 mg/dL — ABNORMAL HIGH (ref 65–99)

## 2016-11-04 LAB — BRAIN NATRIURETIC PEPTIDE: B Natriuretic Peptide: 33.8 pg/mL (ref 0.0–100.0)

## 2016-11-04 LAB — PREALBUMIN

## 2016-11-04 MED ORDER — LORAZEPAM 2 MG/ML IJ SOLN
0.5000 mg | Freq: Once | INTRAMUSCULAR | Status: AC
  Administered 2016-11-04: 0.5 mg via INTRAVENOUS
  Filled 2016-11-04: qty 1

## 2016-11-04 MED ORDER — ZOLPIDEM TARTRATE 10 MG PO TABS
10.0000 mg | ORAL_TABLET | Freq: Every evening | ORAL | Status: DC | PRN
  Administered 2016-11-04: 10 mg via ORAL
  Filled 2016-11-04: qty 1

## 2016-11-04 MED ORDER — PROMETHAZINE HCL 25 MG/ML IJ SOLN
12.5000 mg | Freq: Four times a day (QID) | INTRAMUSCULAR | Status: DC | PRN
  Administered 2016-11-05: 12.5 mg via INTRAVENOUS
  Filled 2016-11-04: qty 1

## 2016-11-04 MED ORDER — GUAIFENESIN-DM 100-10 MG/5ML PO SYRP
5.0000 mL | ORAL_SOLUTION | ORAL | Status: DC | PRN
  Administered 2016-11-04 – 2016-11-07 (×7): 5 mL via ORAL
  Filled 2016-11-04 (×8): qty 10

## 2016-11-04 MED ORDER — BOOST / RESOURCE BREEZE PO LIQD
1.0000 | Freq: Three times a day (TID) | ORAL | Status: DC
  Administered 2016-11-04 – 2016-11-05 (×2): 1 via ORAL

## 2016-11-04 NOTE — Progress Notes (Signed)
[facility]                                                                              Patient Demographics  Rita Frazier, is a 41 y.o. female, DOB - 03/21/1976, GTX:[id number]  Admit date - 11/02/2016   Admitting Physician Lady Deutscher, MD  Outpatient Primary MD for the patient is Patient, No Pcp Per  Outpatient specialists:  Dustin Folks, MD at [facility] (oncologist  LOS - 2  days    Chief Complaint  Patient presents with  . Fever  . Cancer Pt  . Tachycardia       Brief summary  Rita Frazier is a 41 y.o. 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 She was tachycardic and tachypneic, and admitted for febrile neutropenia with sepsis  Assessment & Plan    Principal Problem:   Sepsis ([place]) Active Problems:   Neutropenia with fever (H[facility])   Hypokalemia   Primary breast cancer with metastasis to other site [facility])   Pharyngitis, acute   Acute esophagitis   Acute deep vein thrombosis (DVT) of axillary vein of left upper extremity ([place])   1 Sepsis: Source unclear- ? pharynx IV antibiotics; Will discontinue vancomycin. Continue with [place].  Urine culture; multiples bacterial morphotype.  Blood culture no growth to date.  NSL.   2 febrile neutropenia: Continue antibiotics WBC has increased to 6.2.  Resolved.   3 Left Arm, DVT.  Report worsening edema. Will check doppler to evaluate for new clot.  On eliquis.   4-acute esophagitis: Continue Diflucan Supportive care  5 acute pharyngitis: Antibiotics, stop IV vancomycin. continue with ancef.  Supportive care Strep pneumonia  negative.   6 protein calorie malnutrition: Due to malignancy Nutrition consulted.   7 nausea and vomiting: Secondary to sepsis KUB negative.  Plan to advance diet today  8 hypokalemia: Due to GI loss Resolved  9 pleural effusion: Likely related to metastatic disease Bilateral, with mild edema NSL fluids.   10 DM 2: Sliding-scale insulin Hold metformin for now - resume when nausea and vomiting controlled  11-Transaminases; repeat labs in am.  Has known metastasis to liver.   Code Status: Full code DVT Prophylaxis:  Eliquis Family Communication: Patient and family updated at bedside  Disposition Plan: Home  Time Spent in minutes   35 minutes  Procedures:    Consultants:     Antimicrobials:      Medications  Scheduled Meds: . apixaban  5 mg Oral BID  . insulin aspart  0-15 Units Subcutaneous TID WC  . insulin aspart  0-5 Units Subcutaneous QHS  . insulin aspart  3 Units Subcutaneous TID WC  . magic mouthwash w/lidocaine  5 mL Oral Q4H  . sodium chloride flush  3 mL Intravenous Q12H   Continuous Infusions: . cefTAZidime (FORTAZ)  IV Stopped (  11/04/16 0630)  . fluconazole (DIFLUCAN) IV Stopped (11/03/16 1328)   PRN Meds:.acetaminophen **OR** acetaminophen, bisacodyl, guaiFENesin-dextromethorphan, HYDROmorphone (DILAUDID) injection, ketorolac, magnesium citrate, ondansetron (ZOFRAN) IV, promethazine, senna-docusate, zolpidem   Antibiotics   Anti-infectives    Start     Dose/Rate Route Frequency Ordered Stop   11/04/16 0000  vancomycin (VANCOCIN) 1,250 mg in sodium chloride 0.9 % 250 mL IVPB  Status:  Discontinued     1,250 mg 166.7 mL/hr over 90 Minutes Intravenous Every 8 hours 11/03/16 2006 11/04/16 1155   11/02/16 1800  vancomycin (VANCOCIN) IVPB 1000 mg/200 mL premix  Status:  Discontinued     1,000 mg 200 mL/hr over 60 Minutes Intravenous Every 8 hours 11/02/16 1013 11/03/16 2006   11/02/16 1700  piperacillin-tazobactam (ZOSYN) IVPB 3.375 g   Status:  Discontinued     3.375 g 12.5 mL/hr over 240 Minutes Intravenous Every 8 hours 11/02/16 0917 11/02/16 1548   11/02/16 1600  cefTAZidime (FORTAZ) 1 g in dextrose 5 % 50 mL IVPB  Status:  Discontinued     1 g 100 mL/hr over 30 Minutes Intravenous Every 8 hours 11/02/16 1548 11/02/16 1553   11/02/16 1600  cefTAZidime (FORTAZ) 2 g in dextrose 5 % 50 mL IVPB     2 g 100 mL/hr over 30 Minutes Intravenous Every 8 hours 11/02/16 1553     11/02/16 1300  fluconazole (DIFLUCAN) IVPB 200 mg     200 mg 100 mL/hr over 60 Minutes Intravenous Every 24 hours 11/02/16 1220     11/02/16 1015  vancomycin (VANCOCIN) IVPB 1000 mg/200 mL premix     1,000 mg 200 mL/hr over 60 Minutes Intravenous  Once 11/02/16 1004 11/02/16 2320   11/02/16 0830  piperacillin-tazobactam (ZOSYN) IVPB 3.375 g     3.375 g 100 mL/hr over 30 Minutes Intravenous  Once 11/02/16 0829 11/02/16 2319   11/02/16 0830  vancomycin (VANCOCIN) IVPB 1000 mg/200 mL premix     1,000 mg 200 mL/hr over 60 Minutes Intravenous  Once 11/02/16 0829 11/02/16 2320        Subjective:   [name] was seen and examined today. She report sore throat, dysphonia.  She has been able to tolerates clears. Wishing to advance diet    Objective:   Vitals:   11/03/16 0500 11/03/16 1302 11/03/16 2300 11/04/16 0513  BP: 132/75 120/70 (!) 126/91 132/72  Pulse: (!) 136 (!) 129 (!) 124 (!) 127  Resp: 20 18 20 20   Temp: 98.9 F (37.2 C)  98.6 F (37 C) 99.6 F (37.6 C)  TempSrc: Oral  Oral Oral  SpO2: 95% 95% 99% 97%  Weight:    102 kg (224 lb 14.4 oz)  Height:        Intake/Output Summary (Last 24 hours) at 11/04/16 1158 Last data filed at 11/04/16 0200  Gross per 24 hour  Intake             3740 ml  Output                0 ml  Net             3740 ml     Wt Readings from Last 3 Encounters:  11/04/16 102 kg (224 lb 14.4 oz)  02/20/14 117.9 kg (260 lb)  08/11/12 114.8 kg (253 lb)     Exam  General: NAD  Neck: Supple , no  JVD  Cardiovascular: Tachycardia, S 1 , S 2 RRR  Respiratory: Decreased breath sounds, normal  respiratory effort.   Gastrointestinal: soft, NT, ND  Ext: elft arm with worsening edema  Neuro:alert, non focal.   Skin; no rash.     Data Reviewed:  I have personally reviewed following labs and imaging studies  Micro Results Recent Results (from the past 240 hour(s))  Rapid strep screen (not at [facility])     Status: None   Collection Time: 11/02/16  8:32 AM  Result Value Ref Range Status   Streptococcus, Group A Screen (Direct) NEGATIVE NEGATIVE Final    Comment: (NOTE) A Rapid Antigen test may result negative if the antigen level in the sample is below the detection level of this test. The FDA has not cleared this test as a stand-alone test therefore the rapid antigen negative result has reflexed to a Group A Strep culture.   Culture, group A strep     Status: None (Preliminary result)   Collection Time: 11/02/16  8:32 AM  Result Value Ref Range Status   Specimen Description THROAT  Final   Special Requests NONE Reflexed from [id number]  Final   Culture   Final    CULTURE REINCUBATED FOR BETTER GROWTH Performed at [facility], [place] [address], [place], [place] [address]    Report Status PENDING  Incomplete  Culture, blood (Routine x 2)     Status: None (Preliminary result)   Collection Time: 11/02/16  8:42 AM  Result Value Ref Range Status   Specimen Description BLOOD RIGHT HAND  Final   Special Requests   Final    BOTTLES DRAWN AEROBIC AND ANAEROBIC Blood Culture adequate volume   Culture   Final    NO GROWTH 1 DAY Performed at [facility], [place] [address] [place], [place] [address]    Report Status PENDING  Incomplete  Culture, blood (Routine x 2)     Status: None (Preliminary result)   Collection Time: 11/02/16  9:06 AM  Result Value Ref Range Status   Specimen Description BLOOD RIGHT ANTECUBITAL  Final   Special Requests   Final    BOTTLES DRAWN AEROBIC AND  ANAEROBIC Blood Culture adequate volume   Culture   Final    NO GROWTH 1 DAY Performed at [facility], [place] [address], [place], [place] [address]    Report Status PENDING  Incomplete  Fungus culture, blood     Status: None (Preliminary result)   Collection Time: 11/02/16  9:06 AM  Result Value Ref Range Status   Specimen Description BLOOD RIGHT HAND  Final   Special Requests Immunocompromised  Final   Culture   Final    NO GROWTH 1 DAY Performed at [facility], [place] [address], [place], [place] [address]    Report Status PENDING  Incomplete  Urine Culture     Status: Abnormal   Collection Time: 11/02/16  9:57 AM  Result Value Ref Range Status   Specimen Description URINE, CLEAN CATCH  Final   Special Requests Immunocompromised  Final   Culture MULTIPLE SPECIES PRESENT, SUGGEST RECOLLECTION (A)  Final   Report Status 11/03/2016 FINAL  Final    Radiology Reports Dg Chest 2 View  Result Date: 11/02/2016 CLINICAL DATA:  Sepsis.  Tachycardia.  Fever. EXAM: CHEST  2 VIEW COMPARISON:  09/08/2016 FINDINGS: Normal heart size. New bilateral pleural effusions, left greater than right. Mild diffuse Innumerable pulmonary nodules are identified in both lungs. Mild diffuse superimposed pulmonary edema noted. IMPRESSION: 1. Diffuse pulmonary metastasis as before. 2. Bilateral  pleural effusions and mild interstitial edema Electronically Signed   By: Kerby Moors M.D.   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 She was tachycardic and tachypneic, and admitted for febrile neutropenia with sepsis EXAM: ABDOMEN - 1 VIEW  COMPARISON:  PET-CT 09/24/2016 FINDINGS: Normal bowel gas pattern. Bilateral pelvic phleboliths. Regional bones unremarkable. IMPRESSION: Negative. Electronically Signed   By: Lucrezia Europe M.D.   On: 11/03/2016 12:57   Ct Angio Chest Pe W And/or Wo Contrast  Result Date: 11/02/2016 CLINICAL DATA:  Tachycardia, metastatic cancer, hemoptysis, left upper extremity DVT. A true pain fever. Tachycardia. EXAM: CT ANGIOGRAPHY CHEST WITH CONTRAST TECHNIQUE: Multidetector CT imaging of the chest was performed using the standard protocol during bolus administration of intravenous contrast. Multiplanar CT image reconstructions and MIPs were obtained to evaluate the vascular anatomy. CONTRAST:  100 cc Isovue 370. COMPARISON:  PET 09/24/2016 and CT chest 09/08/2016. FINDINGS: Cardiovascular: Image quality is degraded by suboptimal opacification of the segmental and subsegmental pulmonary arteries as well as respiratory motion. No central or lobar pulmonary embolus. Vascular structures are unremarkable. Heart is at the upper limits of normal in size to mildly enlarged. No pericardial effusion. Mediastinum/Nodes: Right lobe of the thyroid is enlarged and contains a heterogeneous nodule measuring 2.9 cm. There is slight leftward deviation of the trachea as well. No pathologically enlarged mediastinal, hilar or axillary lymph nodes. Surgical clips in both axillary regions. Esophagus maybe slightly thickened. Lungs/Pleura: Hematogenously distributed pulmonary nodules are seen throughout the lungs bilaterally. Index nodule in the medial right lower lobe measures 11 mm (series 11, image 46), previously 10 mm. Scattered septal thickening is seen bilaterally, as before. Bilateral pleural effusions, moderate on the right and small on the left. Compressive atelectasis in both lower lobes. Airway is unremarkable. Upper Abdomen: Multiple ill-defined mildly hypoattenuating lesions are seen in the liver, measuring up to approximately 3.5 cm  centrally, as before. Visualized portions of the adrenal glands, spleen and stomach are grossly unremarkable. Musculoskeletal: Sclerotic lesions are seen in the right humeral head and lower thoracic spine. Review of the MIP images confirms the above findings. IMPRESSION: 1. Segmental and subsegmental pulmonary arteries are poorly opacified, limiting evaluation. No central or lobar pulmonary embolus. 2. Pulmonary, hepatic and osseous metastatic disease, grossly stable. 3. Bilateral pleural effusions, right greater than left. 4. Right thyroid nodule, indeterminate. Electronically Signed   By: Lorin Picket M.D.   On: 11/02/2016 11:55    Lab Data:  CBC:  Recent Labs Lab 11/02/16 0842 11/03/16 0519 11/04/16 0502  WBC 1.0* 2.4* 6.2  NEUTROABS 0.0* 0.2*  --   HGB 10.5* 9.2* 9.5*  HCT 31.2* 28.0* 28.8*  MCV 87.4 88.6 88.9  PLT 136* 168 235   Basic Metabolic Panel:  Recent Labs Lab 11/02/16 0842 11/03/16 0519 11/04/16 0502  NA 138 144 143  K 3.2* 3.6 3.7  CL 106 113* 114*  CO2 22 23 22   GLUCOSE 162* 102* 128*  BUN 7 6 <5*  CREATININE 0.73 0.75 0.72  CALCIUM 8.2* 8.1* 8.3*   GFR: Estimated Creatinine Clearance: 123.3 mL/min (by C-G formula based on SCr of 0.72 mg/dL). Liver Function Tests:  Recent Labs Lab 11/02/16 0842 11/03/16 0519  AST 60* 48*  ALT 34 29  ALKPHOS 192* 156*  BILITOT 3.9* 2.7*  PROT 7.0 6.5  ALBUMIN 2.6* 2.2*   No results for input(s): LIPASE, AMYLASE in the last 168 hours. No results for input(s): AMMONIA in the last 168 hours. Coagulation Profile:  Recent Labs Lab 11/02/16 0842 11/02/16 1605  INR 2.64 2.65   Cardiac Enzymes: No results for input(s): CKTOTAL, CKMB, CKMBINDEX, TROPONINI in the last 168 hours. BNP (last 3 results) No results for input(s): PROBNP in the last 8760 hours. HbA1C:  Recent Labs  11/02/16 1605  HGBA1C 5.6   CBG:  Recent Labs Lab 11/03/16 0719 11/03/16 1122 11/03/16 1649 11/03/16 2129 11/04/16 0735    GLUCAP 103* 170* 126* 124* 133*   Lipid Profile: No results for input(s): CHOL, HDL, LDLCALC, TRIG, CHOLHDL, LDLDIRECT in the last 72 hours. Thyroid Function Tests: No results for input(s): TSH, T4TOTAL, FREET4, T3FREE, THYROIDAB in the last 72 hours. Anemia Panel: No results for input(s): VITAMINB12, FOLATE, FERRITIN, TIBC, IRON, RETICCTPCT in the last 72 hours. Urine analysis:    Component Value Date/Time   COLORURINE YELLOW 11/02/2016 0957   APPEARANCEUR HAZY (A) 11/02/2016 0957   LABSPEC 1.004 (L) 11/02/2016 0957   PHURINE 6.0 11/02/2016 0957   GLUCOSEU NEGATIVE 11/02/2016 0957   HGBUR MODERATE (A) 11/02/2016 0957   BILIRUBINUR NEGATIVE 11/02/2016 0957   BILIRUBINUR neg 06/30/2012 1151   KETONESUR 5 (A) 11/02/2016 0957   PROTEINUR NEGATIVE 11/02/2016 0957   UROBILINOGEN 1.0 02/20/2014 2326   NITRITE NEGATIVE 11/02/2016 0957   LEUKOCYTESUR NEGATIVE 11/02/2016 0957     Niel Hummer A M.D. [facility] 11/04/2016, 11:58 AM  Pager: [phone] Between 7am to 7pm - call Pager - [phone]  After 7pm go to [email or web address] - password [email or web address]  Call night coverage person covering after 7pm

## 2016-11-04 NOTE — Progress Notes (Signed)
Initial Nutrition Assessment  INTERVENTION:   Boost Breeze po TID, each supplement provides 250 kcal and 9 grams of protein  Recommend scheduled anti-nausea medications to help with nausea prior to meals   NUTRITION DIAGNOSIS:   Inadequate oral intake related to cancer and cancer related treatments as evidenced by per patient/family report.  GOAL:   Patient will meet greater than or equal to 90% of their needs  MONITOR:   PO intake, Supplement acceptance, Diet advancement  REASON FOR ASSESSMENT:   Consult Assessment of nutrition requirement/status  ASSESSMENT:   (P) Pt with PMH of DM, recurrent metastatic invasive ductal ca of L breast currently undergoing chemo, DVT LUE admitted with febrile neutropenia with sepsis (fever, N/V, decreased appetite). Pt also has acute esophagitis and acute pharyngitis on diflucan.    Pt with sore throat so husband, sister, and daughters provide hx. Pt reports weight loss but unsure of how much. Reports good appetite until a few days ago but also reports N/V off and on during her chemo which started 3 weeks ago. Prior to this her last chemo was 2 years ago.   Medications reviewed and include: magic mouthwash, sliding scale insulin, NS with 40 mEq KCl @ 125 ml/hr  Labs reviewed: Prealbumin <5 - Prealbumin is a acute phase protein and is strongly affected by stress response and inflammatory process, however pt is at risk for malnutrition given acute symptoms of nausea and esophagitis.  Nutrition-Focused physical exam completed. Findings are no fat depletion, mild/moderate muscle depletion in temples, and mild edema.   CBG's: [phone] Hemoglobin A1C: 5.6  Diet Order:  DIET SOFT Room service appropriate? Yes; Fluid consistency: Thin  Skin:  Reviewed, no issues  Last BM:  unknown  Height:   Ht Readings from Last 1 Encounters:  11/02/16 5\' 10"  (1.778 m)    Weight:   Wt Readings from Last 1 Encounters:  11/04/16 224 lb 14.4 oz (102 kg)     Ideal Body Weight:  68.1 kg  BMI:  Body mass index is 32.27 kg/m.  Estimated Nutritional Needs:   Kcal:  2100-2300  Protein:  105-115 grams  Fluid:  > 2.1 L/day  EDUCATION NEEDS:   Education needs addressed  Maylon Peppers RD, [place], [place] Pager [phone] After Hours Pager

## 2016-11-04 NOTE — Care Management Note (Signed)
Case Management Note  Patient Details  Name: Rita Frazier MRN: [id number] Date of Birth: [birth year]  Subjective/Objective:   41 y/o f admitted w/Sepsis. From home.                 Action/Plan:d/c plan home.   Expected Discharge Date:                  Expected Discharge Plan:  Home/Self Care  In-House Referral:     Discharge planning Services  CM Consult  Post Acute Care Choice:    Choice offered to:     DME Arranged:    DME Agency:     HH Arranged:    HH Agency:     Status of Service:  In process, will continue to follow  If discussed at Long Length of Stay Meetings, dates discussed:    Additional Comments:  Dessa Phi, RN 11/04/2016, 10:11 AM

## 2016-11-04 NOTE — Progress Notes (Signed)
*  PRELIMINARY RESULTS* Vascular Ultrasound Left upper extremity venous duplex has been completed.  Preliminary findings: Findings are suggestive of indeterminate age, non occlusive, deep vein thrombosis involving the left subclavian, axillary and brachial veins.  These veins are non compressible and appear to have dampened monophasic flow.   The basilic vein appears non compressible as well.  Incidental findings are consistent with: hypoechoic areas seen in left forearm, area of pain.  the more inferior area measures 8.7 x 6.79mm, the more superior area measures 7.5x 5.16mm.  Rita Frazier 11/04/2016, 1:53 PM

## 2016-11-05 ENCOUNTER — Inpatient Hospital Stay (HOSPITAL_COMMUNITY): Payer: 59

## 2016-11-05 LAB — BASIC METABOLIC PANEL
Anion gap: 8 (ref 5–15)
BUN: 5 mg/dL — ABNORMAL LOW (ref 6–20)
CHLORIDE: 115 mmol/L — AB (ref 101–111)
CO2: 23 mmol/L (ref 22–32)
Calcium: 8.5 mg/dL — ABNORMAL LOW (ref 8.9–10.3)
Creatinine, Ser: 0.76 mg/dL (ref 0.44–1.00)
GFR calc non Af Amer: >60 mL/min (ref >60)
Glucose, Bld: 114 mg/dL — ABNORMAL HIGH (ref 65–99)
POTASSIUM: 3.7 mmol/L (ref 3.5–5.1)
SODIUM: 146 mmol/L — AB (ref 135–145)

## 2016-11-05 LAB — CBC WITH DIFFERENTIAL/PLATELET
BASOS ABS: 0.1 10*3/uL (ref 0.0–0.1)
Basophils Relative: 1 %
EOS PCT: 0 %
Eosinophils Absolute: 0 10*3/uL (ref 0.0–0.7)
HEMATOCRIT: 28.5 % — AB (ref 36.0–46.0)
HEMOGLOBIN: 9.3 g/dL — AB (ref 12.0–15.0)
LYMPHS ABS: 1.7 10*3/uL (ref 0.7–4.0)
LYMPHS PCT: 15 %
MCH: 29.3 pg (ref 26.0–34.0)
MCHC: 32.6 g/dL (ref 30.0–36.0)
MCV: 89.9 fL (ref 78.0–100.0)
MONOS PCT: 24 %
Monocytes Absolute: 2.6 10*3/uL — ABNORMAL HIGH (ref 0.1–1.0)
NEUTROS ABS: 6.6 10*3/uL (ref 1.7–7.7)
Neutrophils Relative %: 60 %
Platelets: 222 10*3/uL (ref 150–400)
RBC: 3.17 MIL/uL — AB (ref 3.87–5.11)
RDW: 15.6 % — ABNORMAL HIGH (ref 11.5–15.5)
WBC: 11 10*3/uL — ABNORMAL HIGH (ref 4.0–10.5)

## 2016-11-05 LAB — GLUCOSE, CAPILLARY
GLUCOSE-CAPILLARY: 110 mg/dL — AB (ref 65–99)
GLUCOSE-CAPILLARY: 127 mg/dL — AB (ref 65–99)
Glucose-Capillary: 96 mg/dL (ref 65–99)

## 2016-11-05 LAB — HEMOGLOBIN A1C
Hgb A1c MFr Bld: 5.7 % — ABNORMAL HIGH (ref 4.8–5.6)
Mean Plasma Glucose: 117 mg/dL

## 2016-11-05 MED ORDER — FLUCONAZOLE 100 MG PO TABS
200.0000 mg | ORAL_TABLET | Freq: Every day | ORAL | Status: DC
  Administered 2016-11-05 – 2016-11-07 (×3): 200 mg via ORAL
  Filled 2016-11-05 (×3): qty 2

## 2016-11-05 MED ORDER — IOPAMIDOL (ISOVUE-300) INJECTION 61%
INTRAVENOUS | Status: AC
  Filled 2016-11-05: qty 100

## 2016-11-05 MED ORDER — PROMETHAZINE HCL 25 MG/ML IJ SOLN
6.2500 mg | Freq: Four times a day (QID) | INTRAMUSCULAR | Status: DC | PRN
  Administered 2016-11-05 – 2016-11-07 (×7): 6.25 mg via INTRAVENOUS
  Filled 2016-11-05 (×7): qty 1

## 2016-11-05 MED ORDER — LORAZEPAM 2 MG/ML IJ SOLN
1.0000 mg | Freq: Once | INTRAMUSCULAR | Status: AC
  Administered 2016-11-05: 1 mg via INTRAVENOUS
  Filled 2016-11-05: qty 1

## 2016-11-05 MED ORDER — IOPAMIDOL (ISOVUE-300) INJECTION 61%
100.0000 mL | Freq: Once | INTRAVENOUS | Status: AC | PRN
  Administered 2016-11-05: 100 mL via INTRAVENOUS

## 2016-11-05 MED ORDER — AMOXICILLIN 250 MG/5ML PO SUSR
500.0000 mg | Freq: Three times a day (TID) | ORAL | Status: DC
  Administered 2016-11-05: 500 mg via ORAL
  Filled 2016-11-05 (×3): qty 10

## 2016-11-05 MED ORDER — AMOXICILLIN 250 MG PO CAPS
500.0000 mg | ORAL_CAPSULE | Freq: Three times a day (TID) | ORAL | Status: DC
  Administered 2016-11-05 – 2016-11-07 (×5): 500 mg via ORAL
  Filled 2016-11-05 (×5): qty 2

## 2016-11-05 NOTE — Care Management Note (Signed)
Case Management Note  Patient Details  Name: Rita Frazier MRN: [id number] Date of Birth: 23-Oct-1975  Subjective/Objective:  41 y/o f admitted w/Sepsis. From home.TD:[place].                  Action/Plan:d/c plan home.   Expected Discharge Date:                  Expected Discharge Plan:  Home/Self Care  In-House Referral:     Discharge planning Services  CM Consult  Post Acute Care Choice:    Choice offered to:     DME Arranged:    DME Agency:     HH Arranged:    HH Agency:     Status of Service:  In process, will continue to follow  If discussed at Long Length of Stay Meetings, dates discussed:    Additional Comments:  Dessa Phi, RN 11/05/2016, 12:15 PM

## 2016-11-05 NOTE — Progress Notes (Signed)
PHARMACIST - PHYSICIAN COMMUNICATION DR:   Rowe Pavy CONCERNING: Antibiotic IV to Oral Route Change Policy  RECOMMENDATION: This patient is receiving diflucan by the intravenous route.  Based on criteria approved by the Pharmacy and Therapeutics Committee, the antibiotic(s) is/are being converted to the equivalent oral dose form(s).   DESCRIPTION: These criteria include:  Patient being treated for a respiratory tract infection, urinary tract infection, cellulitis or clostridium difficile associated diarrhea if on metronidazole  The patient is not neutropenic and does not exhibit a GI malabsorption state  The patient is eating (either orally or via tube) and/or has been taking other orally administered medications for a least 24 hours  The patient is improving clinically and has a Tmax < 100.5  If you have questions about this conversion, please contact the Pharmacy Department  []   [phone] )  [facility] []   [phone] )  [facility] []   [phone] )  [facility] []   [phone] )  [facility] [x]   [phone] )  [place], PharmD, [place] Pager [phone] 11/05/2016 12:13 PM

## 2016-11-05 NOTE — Plan of Care (Signed)
Problem: Activity: Goal: Risk for activity intolerance will decrease Outcome: Progressing Pt able to ambulate in hall with husband this shift.

## 2016-11-05 NOTE — Progress Notes (Signed)
[facility]                                                                              Patient Demographics  Rita Frazier, is a 41 y.o. female, DOB - 09-16-1975, QTM:[id number]  Admit date - 11/02/2016   Admitting Physician Lady Deutscher, MD  Outpatient Primary MD for the patient is Patient, No Pcp Per  Outpatient specialists:  Dustin Folks, MD at [facility] (oncologist  LOS - 3  days    Chief Complaint  Patient presents with  . Fever  . Cancer Pt  . Tachycardia       Brief summary  Rita Frazier is a 41 y.o. 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 She was tachycardic and tachypneic, and admitted for febrile neutropenia with sepsis  Assessment & Plan    Principal Problem:   Sepsis ([place]) Active Problems:   Neutropenia with fever (HCC)   Hypokalemia   Primary breast cancer with metastasis to other site [facility])   Pharyngitis, acute   Acute esophagitis   Acute deep vein thrombosis (DVT) of axillary vein of left upper extremity ([place])   1 Sepsis: Source unclear- ? pharynx IV antibiotics; d.c fortaz, now t begin with liquid amoxicillin and monitor.  Urine culture; multiples bacterial morphotype.  Blood culture no growth to date.  NSL.   2 febrile neutropenia: Continue antibiotics WBC has increased to 11  Resolved.   3 Left Arm, DVT.  Report ongoing edema.  Doppler results from 7-30 are as noted below: - Findings are suggestive of indeterminate age, non occlusive, deep   vein thrombosis involving the left subclavian, axillary and   brachial veins. These veins are non compressible  and appear to   have dampened monophasic flow. The basilic vein appears non   compressible as well. - Incidental findings are consistent with: hypoechoic areas seen in   left forearm, area of pain. the more inferior area measures 8.7 x   6.50mm, the more superior area measures 7.5x 5.61mm.  On eliquis. May need CT LUE for additional clarification. Rule out LUE soft tissue injury or abscess.   4-acute esophagitis: Continue Diflucan Supportive care  5 acute pharyngitis: Antibiotics, now on PO Amoxicillin.   Supportive care Strep pneumonia negative.   6 protein calorie malnutrition: Due to malignancy Nutrition consulted.   7 nausea and vomiting: Secondary to sepsis KUB negative.  toleratimg soft diet.  8 hypokalemia: Due to GI loss Resolved, K 3.7 today.   9 pleural effusion: Likely related to metastatic disease Bilateral, with mild edema NSL fluids.   10 DM 2: Sliding-scale insulin Hold metformin for now - resume when nausea and vomiting controlled  11-Transaminases; repeat labs in am.  Has known metastasis to liver.   Code Status: Full code DVT Prophylaxis:  Eliquis Family Communication: Patient and  family ( sister) updated at bedside  Disposition Plan: Home in 1-2 days depending on hospital course and further results.   Time Spent in minutes   35 minutes  Procedures:    Consultants:     Antimicrobials:   As above    Medications  Scheduled Meds: . apixaban  5 mg Oral BID  . feeding supplement  1 Container Oral TID BM  . insulin aspart  0-15 Units Subcutaneous TID WC  . insulin aspart  0-5 Units Subcutaneous QHS  . insulin aspart  3 Units Subcutaneous TID WC  . magic mouthwash w/lidocaine  5 mL Oral Q4H  . sodium chloride flush  3 mL Intravenous Q12H   Continuous Infusions: . cefTAZidime (FORTAZ)  IV Stopped (11/04/16 2317)  . fluconazole (DIFLUCAN) IV Stopped (11/04/16 1400)   PRN Meds:.acetaminophen **OR** acetaminophen, bisacodyl,  guaiFENesin-dextromethorphan, HYDROmorphone (DILAUDID) injection, ketorolac, magnesium citrate, ondansetron (ZOFRAN) IV, promethazine, senna-docusate, zolpidem   Antibiotics   Anti-infectives    Start     Dose/Rate Route Frequency Ordered Stop   11/04/16 0000  vancomycin (VANCOCIN) 1,250 mg in sodium chloride 0.9 % 250 mL IVPB  Status:  Discontinued     1,250 mg 166.7 mL/hr over 90 Minutes Intravenous Every 8 hours 11/03/16 2006 11/04/16 1155   11/02/16 1800  vancomycin (VANCOCIN) IVPB 1000 mg/200 mL premix  Status:  Discontinued     1,000 mg 200 mL/hr over 60 Minutes Intravenous Every 8 hours 11/02/16 1013 11/03/16 2006   11/02/16 1700  piperacillin-tazobactam (ZOSYN) IVPB 3.375 g  Status:  Discontinued     3.375 g 12.5 mL/hr over 240 Minutes Intravenous Every 8 hours 11/02/16 0917 11/02/16 1548   11/02/16 1600  cefTAZidime (FORTAZ) 1 g in dextrose 5 % 50 mL IVPB  Status:  Discontinued     1 g 100 mL/hr over 30 Minutes Intravenous Every 8 hours 11/02/16 1548 11/02/16 1553   11/02/16 1600  cefTAZidime (FORTAZ) 2 g in dextrose 5 % 50 mL IVPB     2 g 100 mL/hr over 30 Minutes Intravenous Every 8 hours 11/02/16 1553     11/02/16 1300  fluconazole (DIFLUCAN) IVPB 200 mg     200 mg 100 mL/hr over 60 Minutes Intravenous Every 24 hours 11/02/16 1220     11/02/16 1015  vancomycin (VANCOCIN) IVPB 1000 mg/200 mL premix     1,000 mg 200 mL/hr over 60 Minutes Intravenous  Once 11/02/16 1004 11/02/16 2320   11/02/16 0830  piperacillin-tazobactam (ZOSYN) IVPB 3.375 g     3.375 g 100 mL/hr over 30 Minutes Intravenous  Once 11/02/16 0829 11/02/16 2319   11/02/16 0830  vancomycin (VANCOCIN) IVPB 1000 mg/200 mL premix     1,000 mg 200 mL/hr over 60 Minutes Intravenous  Once 11/02/16 0829 11/02/16 2320        Subjective:   [name] was seen and examined today. She report sore throat, dysphonia.  She has been able to soft diet.  She has ongoing swelling of her LUE. Venous doppler results  noted.   Objective:   Vitals:   11/04/16 0513 11/04/16 1528 11/04/16 2125 11/05/16 0458  BP: 132/72 140/87 138/68 (!) 147/95  Pulse: (!) 127 (!) 128 (!) 127 (!) 132  Resp: 20 19 18 17   Temp: 99.6 F (37.6 C) 99.2 F (37.3 C) 99.5 F (37.5 C) 98.4 F (36.9 C)  TempSrc: Oral Oral Oral Oral  SpO2: 97% 99% 95% 94%  Weight: 102 kg (224 lb 14.4 oz)   107.9  kg (237 lb 14 oz)  Height:        Intake/Output Summary (Last 24 hours) at 11/05/16 1208 Last data filed at 11/05/16 0459  Gross per 24 hour  Intake              690 ml  Output                0 ml  Net              690 ml     Wt Readings from Last 3 Encounters:  11/05/16 107.9 kg (237 lb 14 oz)  02/20/14 117.9 kg (260 lb)  08/11/12 114.8 kg (253 lb)     Exam  General: NAD  Neck: Supple , no JVD  Cardiovascular: Tachycardia, S 1 , S 2 RRR  Respiratory: Decreased breath sounds, normal respiratory effort.   Gastrointestinal: soft, NT, ND  Ext: left arm with ongoing edema, worse on the front of the hand.   Neuro:alert, non focal.   Skin; no rash.     Data Reviewed:  I have personally reviewed following labs and imaging studies  Micro Results Recent Results (from the past 240 hour(s))  Rapid strep screen (not at [facility])     Status: None   Collection Time: 11/02/16  8:32 AM  Result Value Ref Range Status   Streptococcus, Group A Screen (Direct) NEGATIVE NEGATIVE Final    Comment: (NOTE) A Rapid Antigen test may result negative if the antigen level in the sample is below the detection level of this test. The FDA has not cleared this test as a stand-alone test therefore the rapid antigen negative result has reflexed to a Group A Strep culture.   Culture, group A strep     Status: None   Collection Time: 11/02/16  8:32 AM  Result Value Ref Range Status   Specimen Description THROAT  Final   Special Requests NONE Reflexed from [id number]  Final   Culture ABUNDANT STREPTOCOCCUS,BETA HEMOLYTIC NOT GROUP A  Final    Report Status 11/04/2016 FINAL  Final  Culture, blood (Routine x 2)     Status: None (Preliminary result)   Collection Time: 11/02/16  8:42 AM  Result Value Ref Range Status   Specimen Description BLOOD RIGHT HAND  Final   Special Requests   Final    BOTTLES DRAWN AEROBIC AND ANAEROBIC Blood Culture adequate volume   Culture   Final    NO GROWTH 2 DAYS Performed at [facility], [address] [place], [place] [address]    Report Status PENDING  Incomplete  Culture, blood (Routine x 2)     Status: None (Preliminary result)   Collection Time: 11/02/16  9:06 AM  Result Value Ref Range Status   Specimen Description BLOOD RIGHT ANTECUBITAL  Final   Special Requests   Final    BOTTLES DRAWN AEROBIC AND ANAEROBIC Blood Culture adequate volume   Culture   Final    NO GROWTH 2 DAYS Performed at [facility], [place] [address], [place], [place] [address]    Report Status PENDING  Incomplete  Fungus culture, blood     Status: None (Preliminary result)   Collection Time: 11/02/16  9:06 AM  Result Value Ref Range Status   Specimen Description BLOOD RIGHT HAND  Final   Special Requests Immunocompromised  Final   Culture   Final    NO GROWTH 2 DAYS Performed at [facility], [place] [address],  [place], [place] [address]    Report Status PENDING  Incomplete  Urine Culture     Status: Abnormal   Collection Time: 11/02/16  9:57 AM  Result Value Ref Range Status   Specimen Description URINE, CLEAN CATCH  Final   Special Requests Immunocompromised  Final   Culture MULTIPLE SPECIES PRESENT, SUGGEST RECOLLECTION (A)  Final   Report Status 11/03/2016 FINAL  Final    Radiology Reports Dg Chest 2 View  Result Date: 11/02/2016 CLINICAL DATA:  Sepsis.  Tachycardia.  Fever. EXAM: CHEST  2 VIEW COMPARISON:  09/08/2016 FINDINGS: Normal heart size. New bilateral pleural effusions, left greater than right. Mild diffuse Innumerable pulmonary nodules are identified in both lungs. Mild  diffuse superimposed pulmonary edema noted. IMPRESSION: 1. Diffuse pulmonary metastasis as before. 2. Bilateral pleural effusions and mild interstitial edema Electronically Signed   By: Kerby Moors M.D.   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 She was tachycardic and tachypneic, and admitted for febrile neutropenia with sepsis EXAM: ABDOMEN - 1 VIEW COMPARISON:  PET-CT 09/24/2016 FINDINGS: Normal bowel gas pattern. Bilateral pelvic phleboliths. Regional bones unremarkable. IMPRESSION: Negative. Electronically Signed   By: Lucrezia Europe M.D.   On: 11/03/2016 12:57   Ct Angio Chest Pe W And/or Wo Contrast  Result Date: 11/02/2016 CLINICAL DATA:  Tachycardia, metastatic cancer, hemoptysis, left upper extremity DVT. A true pain fever. Tachycardia. EXAM: CT ANGIOGRAPHY CHEST WITH CONTRAST TECHNIQUE: Multidetector CT imaging of the chest was performed using the standard protocol during bolus administration of intravenous contrast. Multiplanar CT image reconstructions and MIPs were obtained to evaluate the vascular anatomy. CONTRAST:  100 cc Isovue 370. COMPARISON:  PET 09/24/2016 and CT chest 09/08/2016. FINDINGS: Cardiovascular: Image quality is degraded by suboptimal opacification of the segmental and subsegmental pulmonary arteries as well as respiratory motion. No central or lobar pulmonary embolus. Vascular structures are unremarkable. Heart is at the upper limits of normal in size to mildly enlarged. No pericardial effusion. Mediastinum/Nodes: Right lobe of the thyroid is enlarged and contains a heterogeneous nodule measuring 2.9 cm.  There is slight leftward deviation of the trachea as well. No pathologically enlarged mediastinal, hilar or axillary lymph nodes. Surgical clips in both axillary regions. Esophagus maybe slightly thickened. Lungs/Pleura: Hematogenously distributed pulmonary nodules are seen throughout the lungs bilaterally. Index nodule in the medial right lower lobe measures 11 mm (series 11, image 46), previously 10 mm. Scattered septal thickening is seen bilaterally, as before. Bilateral pleural effusions, moderate on the right and small on the left. Compressive atelectasis in both lower lobes. Airway is unremarkable. Upper Abdomen: Multiple ill-defined mildly hypoattenuating lesions are seen in the liver, measuring up to approximately 3.5 cm centrally, as before. Visualized portions of the adrenal glands, spleen and stomach are grossly unremarkable. Musculoskeletal: Sclerotic lesions are seen in the right humeral head and lower thoracic spine. Review of the MIP images confirms the above findings. IMPRESSION: 1. Segmental and subsegmental pulmonary arteries are poorly opacified, limiting evaluation. No central or lobar pulmonary embolus. 2. Pulmonary, hepatic and osseous metastatic disease, grossly stable. 3. Bilateral pleural effusions, right greater than left. 4. Right thyroid nodule, indeterminate. Electronically Signed  By: Lorin Picket M.D.   On: 11/02/2016 11:55    Lab Data:  CBC:  Recent Labs Lab 11/02/16 0842 11/03/16 0519 11/04/16 0502 11/05/16 0449  WBC 1.0* 2.4* 6.2 11.0*  NEUTROABS 0.0* 0.2*  --  6.6  HGB 10.5* 9.2* 9.5* 9.3*  HCT 31.2* 28.0* 28.8* 28.5*  MCV 87.4 88.6 88.9 89.9  PLT 136* 168 212 765   Basic Metabolic Panel:  Recent Labs Lab 11/02/16 0842 11/03/16 0519 11/04/16 0502 11/05/16 0449  NA 138 144 143 146*  K 3.2* 3.6 3.7 3.7  CL 106 113* 114* 115*  CO2 22 23 22 23   GLUCOSE 162* 102* 128* 114*  BUN 7 6 <5* 5*  CREATININE 0.73 0.75 0.72 0.76  CALCIUM 8.2* 8.1* 8.3* 8.5*    GFR: Estimated Creatinine Clearance: 126.9 mL/min (by C-G formula based on SCr of 0.76 mg/dL). Liver Function Tests:  Recent Labs Lab 11/02/16 0842 11/03/16 0519  AST 60* 48*  ALT 34 29  ALKPHOS 192* 156*  BILITOT 3.9* 2.7*  PROT 7.0 6.5  ALBUMIN 2.6* 2.2*   No results for input(s): LIPASE, AMYLASE in the last 168 hours. No results for input(s): AMMONIA in the last 168 hours. Coagulation Profile:  Recent Labs Lab 11/02/16 0842 11/02/16 1605  INR 2.64 2.65   Cardiac Enzymes: No results for input(s): CKTOTAL, CKMB, CKMBINDEX, TROPONINI in the last 168 hours. BNP (last 3 results) No results for input(s): PROBNP in the last 8760 hours. HbA1C:  Recent Labs  11/02/16 1605 11/04/16 0502  HGBA1C 5.6 5.7*   CBG:  Recent Labs Lab 11/04/16 1231 11/04/16 1636 11/04/16 2133 11/05/16 0745 11/05/16 1136  GLUCAP 113* 104* 115* 110* 127*   Lipid Profile: No results for input(s): CHOL, HDL, LDLCALC, TRIG, CHOLHDL, LDLDIRECT in the last 72 hours. Thyroid Function Tests: No results for input(s): TSH, T4TOTAL, FREET4, T3FREE, THYROIDAB in the last 72 hours. Anemia Panel: No results for input(s): VITAMINB12, FOLATE, FERRITIN, TIBC, IRON, RETICCTPCT in the last 72 hours. Urine analysis:    Component Value Date/Time   COLORURINE YELLOW 11/02/2016 0957   APPEARANCEUR HAZY (A) 11/02/2016 0957   LABSPEC 1.004 (L) 11/02/2016 0957   PHURINE 6.0 11/02/2016 0957   GLUCOSEU NEGATIVE 11/02/2016 0957   HGBUR MODERATE (A) 11/02/2016 0957   BILIRUBINUR NEGATIVE 11/02/2016 0957   BILIRUBINUR neg 06/30/2012 1151   KETONESUR 5 (A) 11/02/2016 0957   PROTEINUR NEGATIVE 11/02/2016 0957   UROBILINOGEN 1.0 02/20/2014 2326   NITRITE NEGATIVE 11/02/2016 0957   LEUKOCYTESUR NEGATIVE 11/02/2016 0957     Loistine Chance M.D. [facility] 11/05/2016, 12:08 PM  Pager:[phone] Between 7am to 7pm - call Pager - [phone]  After 7pm go to [email or web address] - password [email or web address]  Call  night coverage person covering after 7pm

## 2016-11-06 DIAGNOSIS — A419 Sepsis, unspecified organism: Principal | ICD-10-CM

## 2016-11-06 DIAGNOSIS — J029 Acute pharyngitis, unspecified: Secondary | ICD-10-CM

## 2016-11-06 DIAGNOSIS — K209 Esophagitis, unspecified: Secondary | ICD-10-CM

## 2016-11-06 DIAGNOSIS — C50919 Malignant neoplasm of unspecified site of unspecified female breast: Secondary | ICD-10-CM

## 2016-11-06 DIAGNOSIS — D709 Neutropenia, unspecified: Secondary | ICD-10-CM

## 2016-11-06 DIAGNOSIS — R5081 Fever presenting with conditions classified elsewhere: Secondary | ICD-10-CM

## 2016-11-06 LAB — CBC WITH DIFFERENTIAL/PLATELET
BASOS ABS: 0.1 10*3/uL (ref 0.0–0.1)
Basophils Relative: 1 %
EOS ABS: 0 10*3/uL (ref 0.0–0.7)
Eosinophils Relative: 0 %
HEMATOCRIT: 27.6 % — AB (ref 36.0–46.0)
Hemoglobin: 8.9 g/dL — ABNORMAL LOW (ref 12.0–15.0)
LYMPHS ABS: 1.2 10*3/uL (ref 0.7–4.0)
LYMPHS PCT: 10 %
MCH: 29.3 pg (ref 26.0–34.0)
MCHC: 32.2 g/dL (ref 30.0–36.0)
MCV: 90.8 fL (ref 78.0–100.0)
MONO ABS: 1.6 10*3/uL — AB (ref 0.1–1.0)
Monocytes Relative: 14 %
NEUTROS PCT: 75 %
NRBC: 2 /100{WBCs} — AB
Neutro Abs: 8.7 10*3/uL — ABNORMAL HIGH (ref 1.7–7.7)
PLATELETS: 201 10*3/uL (ref 150–400)
RBC: 3.04 MIL/uL — ABNORMAL LOW (ref 3.87–5.11)
RDW: 16.1 % — AB (ref 11.5–15.5)
WBC: 11.6 10*3/uL — ABNORMAL HIGH (ref 4.0–10.5)

## 2016-11-06 LAB — COMPREHENSIVE METABOLIC PANEL
ALT: 25 U/L (ref 14–54)
ANION GAP: 6 (ref 5–15)
AST: 69 U/L — AB (ref 15–41)
Albumin: 2.1 g/dL — ABNORMAL LOW (ref 3.5–5.0)
Alkaline Phosphatase: 292 U/L — ABNORMAL HIGH (ref 38–126)
BILIRUBIN TOTAL: 1.7 mg/dL — AB (ref 0.3–1.2)
BUN: 7 mg/dL (ref 6–20)
CHLORIDE: 111 mmol/L (ref 101–111)
CO2: 27 mmol/L (ref 22–32)
Calcium: 8.5 mg/dL — ABNORMAL LOW (ref 8.9–10.3)
Creatinine, Ser: 0.78 mg/dL (ref 0.44–1.00)
GFR calc Af Amer: >60 mL/min (ref >60)
Glucose, Bld: 105 mg/dL — ABNORMAL HIGH (ref 65–99)
POTASSIUM: 3.7 mmol/L (ref 3.5–5.1)
Sodium: 144 mmol/L (ref 135–145)
TOTAL PROTEIN: 5.9 g/dL — AB (ref 6.5–8.1)

## 2016-11-06 LAB — GLUCOSE, CAPILLARY
GLUCOSE-CAPILLARY: 102 mg/dL — AB (ref 65–99)
GLUCOSE-CAPILLARY: 108 mg/dL — AB (ref 65–99)
Glucose-Capillary: 106 mg/dL — ABNORMAL HIGH (ref 65–99)
Glucose-Capillary: 112 mg/dL — ABNORMAL HIGH (ref 65–99)
Glucose-Capillary: 108 mg/dL — ABNORMAL HIGH (ref 65–99)

## 2016-11-06 MED ORDER — FUROSEMIDE 10 MG/ML IJ SOLN: 60.0000 mg | Freq: Once | INTRAMUSCULAR | Status: DC

## 2016-11-06 MED ORDER — FUROSEMIDE 10 MG/ML IJ SOLN
60.0000 mg | Freq: Two times a day (BID) | INTRAMUSCULAR | Status: DC
  Administered 2016-11-06: 60 mg via INTRAVENOUS
  Filled 2016-11-06: qty 6

## 2016-11-06 NOTE — Progress Notes (Addendum)
Patient ID: Rita Frazier, female   DOB: 04/05/1976, 41 y.o.   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 She was tachycardic and tachypneic, and admitted for febrile neutropenia with sepsis.   Assessment & Plan:   Principal Problem:   Sepsis ([place]) Active Problems:   Neutropenia with fever ([facility])   Hypokalemia   Primary breast cancer with metastasis to other site [facility])   Pharyngitis, acute   Acute esophagitis   Acute deep vein thrombosis (DVT) of axillary vein of left upper extremity ([place])   1 Sepsis: Source unclear- ? Pharynx Resolved Status post ceftazidime and vancomycin; switched to oral amoxicillin on 11/05/2016. urine culture: multiples bacterial morphotype.  Blood culture no growth to date.    2 febrile neutropenia: Resolved.  Patient had low-grade temperature on 11/05/2016. Monitor temperature curve Continue with oral amoxicillin  3 Left Arm, DVT.  Reports ongoing edema.  Continue with Eliquis. CT shows diffuse edema. This can be as a result of lymphedema. Give 2 doses of Lasix as patient has had positive fluid balance since admission  4-acute esophagitis: Continue Diflucan Patient might need outpatient GI evaluation and probable endoscopy if symptoms don't improve with Diflucan.  5 acute pharyngitis: Continue Amoxicillin.   Supportive care Strep pneumonia negative.   6 protein calorie malnutrition: Due to malignancy Nutrition consulted.   7 nausea and vomiting: toleratimg soft diet. Continue Zofran when necessary nausea  8 hypokalemia: Resolved  9 pleural  effusion: Likely related to metastatic disease 2 doses of Lasix today  10 DM 2: Sliding-scale insulin Hold metformin for now - resume when nausea and vomiting controlled  11-Transaminases; repeat labs in am.  Has known metastasis to liver.   Code Status: Full code DVT Prophylaxis:  Eliquis Family Communication: Patient and daughters present at bedside  Disposition Plan: Home in 1-2 days depending on hospital course and further results.    Procedures:  Doppler results from 7-30 are as noted below: - Findings are suggestive of indeterminate age, non occlusive, deep vein thrombosis involving the left subclavian, axillary and brachial veins. These veins are non compressible and appear to have dampened monophasic flow. The basilic vein appears non compressible as well. - Incidental findings are consistent with: hypoechoic areas seen in left forearm, area of pain. the more inferior area measures 8.7 x 6.55mm, the more superior area measures 7.5x 5.86mm.  Consultants:   none   Antimicrobials:   Fortaz and vancomycin from 11/02/2016 to 11/04/2016  Amoxicillin from 11/05/2016 onwards  Diflucan from 11/02/2016 onwards   Subjective: Patient seen and examined at bedside. She feels better but still complains of left upper extremity swelling. She is intermittently nauseous. No diarrhea, abdominal pain.  Objective: Vitals:   11/05/16 0458 11/05/16 1446 11/05/16 2105 11/06/16 0537  BP: (!) 147/95 132/84 140/77 (!) 142/81  Pulse: (!) 132 (!) 121 (!) 117 (!) 111  Resp: 17 20 18 16   Temp: 98.4 F (36.9 C) 100.1 F (37.8 C) 98.6 F (37 C) 99.1 F (37.3 C)  TempSrc: Oral Oral Oral Oral  SpO2: 94% 94% 94% 98%  Weight: 107.9 kg (237 lb 14 oz)   103.5 kg (228 lb 3.2 oz)  Height:        Intake/Output Summary (Last 24 hours) at 11/06/16 0937 Last data filed at 11/05/16 2200  Gross per 24 hour  Intake              120 ml  Output                0 ml  Net               120 ml   Filed Weights   11/04/16 0513 11/05/16 0458 11/06/16 0537  Weight: 102 kg (224 lb 14.4 oz) 107.9 kg (237 lb 14 oz) 103.5 kg (228 lb 3.2 oz)    Examination:  General exam: Appears calm and comfortable  Respiratory system: Bilateral decreased breath sound at bases Cardiovascular system: S1 & S2 heard, intermittent tachycardia  Gastrointestinal system: Abdomen is nondistended, soft and nontender. Normal bowel sounds heard. Extremities: Left upper extremity with ongoing edema; no cyanosis    Data Reviewed: I have personally reviewed following labs and imaging studies  CBC:  Recent Labs Lab 11/02/16 0842 11/03/16 0519 11/04/16 0502 11/05/16 0449 11/06/16 0510  WBC 1.0* 2.4* 6.2 11.0* 11.6*  NEUTROABS 0.0* 0.2*  --  6.6 8.7*  HGB 10.5* 9.2* 9.5* 9.3* 8.9*  HCT 31.2* 28.0* 28.8* 28.5* 27.6*  MCV 87.4 88.6 88.9 89.9 90.8  PLT 136* 168 212 222 539   Basic Metabolic Panel:  Recent Labs Lab 11/02/16 0842 11/03/16 0519 11/04/16 0502 11/05/16 0449 11/06/16 0510  NA 138 144 143 146* 144  K 3.2* 3.6 3.7 3.7 3.7  CL 106 113* 114* 115* 111  CO2 22 23 22 23 27   GLUCOSE 162* 102* 128* 114* 105*  BUN 7 6 <5* 5* 7  CREATININE 0.73 0.75 0.72 0.76 0.78  CALCIUM 8.2* 8.1* 8.3* 8.5* 8.5*   GFR: Estimated Creatinine Clearance: 124.2 mL/min (by C-G formula based on SCr of 0.78 mg/dL). Liver Function Tests:  Recent Labs Lab 11/02/16 0842 11/03/16 0519 11/06/16 0510  AST 60* 48* 69*  ALT 34 29 25  ALKPHOS 192* 156* 292*  BILITOT 3.9* 2.7* 1.7*  PROT 7.0 6.5 5.9*  ALBUMIN 2.6* 2.2* 2.1*   No results for input(s): LIPASE, AMYLASE in the last 168 hours. No results for input(s): AMMONIA in the last 168 hours. Coagulation Profile:  Recent Labs Lab 11/02/16 0842 11/02/16 1605  INR 2.64 2.65   Cardiac Enzymes: No results for input(s): CKTOTAL, CKMB, CKMBINDEX, TROPONINI in the last 168 hours. BNP (last 3 results) No results for input(s): PROBNP in the  last 8760 hours. HbA1C:  Recent Labs  11/04/16 0502  HGBA1C 5.7*   CBG:  Recent Labs Lab 11/04/16 2133 11/05/16 0745 11/05/16 1136 11/05/16 1737 11/06/16 0738  GLUCAP 115* 110* 127* 96 108*   Lipid Profile: No results for input(s): CHOL, HDL, LDLCALC, TRIG, CHOLHDL, LDLDIRECT in the last 72 hours. Thyroid Function Tests: No results for input(s): TSH, T4TOTAL, FREET4, T3FREE, THYROIDAB in the last 72 hours. Anemia Panel: No results for input(s): VITAMINB12, FOLATE, FERRITIN, TIBC, IRON, RETICCTPCT in the last 72 hours. Sepsis Labs:  Recent Labs Lab 11/02/16 0847 11/02/16 1119 11/02/16 1605 11/02/16 2005  LATICACIDVEN 1.39 1.62 1.9 1.7    Recent Results (from the past 240 hour(s))  Rapid strep screen (not at [facility])     Status: None   Collection Time: 11/02/16  8:32 AM  Result Value Ref Range Status   Streptococcus, Group A Screen (Direct) NEGATIVE NEGATIVE Final    Comment: (NOTE)  A Rapid Antigen test may result negative if the antigen level in the sample is below the detection level of this test. The FDA has not cleared this test as a stand-alone test therefore the rapid antigen negative result has reflexed to a Group A Strep culture.   Culture, group A strep     Status: None   Collection Time: 11/02/16  8:32 AM  Result Value Ref Range Status   Specimen Description THROAT  Final   Special Requests NONE Reflexed from [id number]  Final   Culture ABUNDANT STREPTOCOCCUS,BETA HEMOLYTIC NOT GROUP A  Final   Report Status 11/04/2016 FINAL  Final  Culture, blood (Routine x 2)     Status: None (Preliminary result)   Collection Time: 11/02/16  8:42 AM  Result Value Ref Range Status   Specimen Description BLOOD RIGHT HAND  Final   Special Requests   Final    BOTTLES DRAWN AEROBIC AND ANAEROBIC Blood Culture adequate volume   Culture   Final    NO GROWTH 3 DAYS Performed at [facility], [address], [place], [place] [address]    Report Status PENDING  Incomplete   Culture, blood (Routine x 2)     Status: None (Preliminary result)   Collection Time: 11/02/16  9:06 AM  Result Value Ref Range Status   Specimen Description BLOOD RIGHT ANTECUBITAL  Final   Special Requests   Final    BOTTLES DRAWN AEROBIC AND ANAEROBIC Blood Culture adequate volume   Culture   Final    NO GROWTH 3 DAYS Performed at [facility], [place] [address], [place], [place] [address]    Report Status PENDING  Incomplete  Fungus culture, blood     Status: None (Preliminary result)   Collection Time: 11/02/16  9:06 AM  Result Value Ref Range Status   Specimen Description BLOOD RIGHT HAND  Final   Special Requests Immunocompromised  Final   Culture   Final    NO GROWTH 3 DAYS Performed at [facility], [place] [address] [place], [place] [address]    Report Status PENDING  Incomplete  Urine Culture     Status: Abnormal   Collection Time: 11/02/16  9:57 AM  Result Value Ref Range Status   Specimen Description URINE, CLEAN CATCH  Final   Special Requests Immunocompromised  Final   Culture MULTIPLE SPECIES PRESENT, SUGGEST RECOLLECTION (A)  Final   Report Status 11/03/2016 FINAL  Final         Radiology Studies: Ct Extrem Up Entire Arm L Wo/w Cm  Result Date: 11/06/2016 CLINICAL DATA:  Left upper extremity DVT with ongoing swelling and pain, concern for abscess. History of metastatic breast cancer. EXAM: CT OF THE UPPER LEFT EXTREMITY WITHOUT AND WITH CONTRAST TECHNIQUE: Multidetector CT imaging of the upper left extremity was performed following the standard protocol before and during bolus administration of intravenous contrast. COMPARISON:  None. CONTRAST:  171mL ISOVUE-300 IOPAMIDOL (ISOVUE-300) INJECTION 61% FINDINGS: Bones/Joint/Cartilage No acute osseous abnormality. No fracture dislocation. No discrete lytic or sclerotic lesions identified. The shoulder and elbow are grossly unremarkable. Ligaments Suboptimally assessed by CT. Soft tissues There is diffuse  subcutaneous edema from the mid upper arm extending into the hand. There is no drainable fluid collection. Normal muscle bulk. Poor opacification of the left subclavian, axillary, and brachial veins, better evaluated on recent left arm venous ultrasound. Surgical clips again noted in the left axilla. Left mastectomy with implant reconstruction. Partially visualized small left  pleural effusion. Innumerable pulmonary nodules within the left lung, consistent with metastases. IMPRESSION: 1. Diffuse subcutaneous edema from the left mid upper arm extending into the hand. No drainable fluid collection. 2. Poor opacification of the left subclavian, axillary, in particular in the hands, better evaluated on recent left upper extremity venous ultrasound. 3. Partially visualized small left pleural effusion. 4. Partially visualized left lung nodules, consistent with metastases. Electronically Signed   By: Titus Dubin M.D.   On: 11/06/2016 08:28        Scheduled Meds: . amoxicillin  500 mg Oral Q8H  . apixaban  5 mg Oral BID  . feeding supplement  1 Container Oral TID BM  . fluconazole  200 mg Oral Daily  . furosemide  60 mg Intravenous Q12H  . insulin aspart  0-15 Units Subcutaneous TID WC  . insulin aspart  0-5 Units Subcutaneous QHS  . insulin aspart  3 Units Subcutaneous TID WC  . magic mouthwash w/lidocaine  5 mL Oral Q4H  . sodium chloride flush  3 mL Intravenous Q12H   Continuous Infusions:   LOS: 4 days        Aline August, MD [facility] Pager [phone]  If 7PM-7AM, please contact night-coverage [email or web address] Password [facility] 11/06/2016, 9:37 AM

## 2016-11-07 DIAGNOSIS — B3781 Candidal esophagitis: Secondary | ICD-10-CM

## 2016-11-07 DIAGNOSIS — I82A12 Acute embolism and thrombosis of left axillary vein: Secondary | ICD-10-CM

## 2016-11-07 DIAGNOSIS — E119 Type 2 diabetes mellitus without complications: Secondary | ICD-10-CM

## 2016-11-07 LAB — CBC WITH DIFFERENTIAL/PLATELET
BASOS PCT: 0 %
Basophils Absolute: 0 10*3/uL (ref 0.0–0.1)
EOS PCT: 0 %
Eosinophils Absolute: 0 10*3/uL (ref 0.0–0.7)
HEMATOCRIT: 30 % — AB (ref 36.0–46.0)
Hemoglobin: 9.7 g/dL — ABNORMAL LOW (ref 12.0–15.0)
LYMPHS PCT: 12 %
Lymphs Abs: 1.8 10*3/uL (ref 0.7–4.0)
MCH: 29 pg (ref 26.0–34.0)
MCHC: 32.3 g/dL (ref 30.0–36.0)
MCV: 89.8 fL (ref 78.0–100.0)
MONOS PCT: 16 %
Monocytes Absolute: 2.4 10*3/uL — ABNORMAL HIGH (ref 0.1–1.0)
Neutro Abs: 11.1 10*3/uL — ABNORMAL HIGH (ref 1.7–7.7)
Neutrophils Relative %: 72 %
PLATELETS: 205 10*3/uL (ref 150–400)
RBC: 3.34 MIL/uL — ABNORMAL LOW (ref 3.87–5.11)
RDW: 16.3 % — ABNORMAL HIGH (ref 11.5–15.5)
WBC: 15.3 10*3/uL — ABNORMAL HIGH (ref 4.0–10.5)
nRBC: 2 /100 WBC — ABNORMAL HIGH

## 2016-11-07 LAB — COMPREHENSIVE METABOLIC PANEL
ALT: 25 U/L (ref 14–54)
AST: 74 U/L — AB (ref 15–41)
Albumin: 2.4 g/dL — ABNORMAL LOW (ref 3.5–5.0)
Alkaline Phosphatase: 351 U/L — ABNORMAL HIGH (ref 38–126)
Anion gap: 8 (ref 5–15)
BILIRUBIN TOTAL: 2 mg/dL — AB (ref 0.3–1.2)
BUN: 8 mg/dL (ref 6–20)
CHLORIDE: 109 mmol/L (ref 101–111)
CO2: 24 mmol/L (ref 22–32)
CREATININE: 0.84 mg/dL (ref 0.44–1.00)
Calcium: 8.3 mg/dL — ABNORMAL LOW (ref 8.9–10.3)
GFR calc Af Amer: >60 mL/min (ref >60)
Glucose, Bld: 107 mg/dL — ABNORMAL HIGH (ref 65–99)
Potassium: 3.3 mmol/L — ABNORMAL LOW (ref 3.5–5.1)
Sodium: 141 mmol/L (ref 135–145)
Total Protein: 6.9 g/dL (ref 6.5–8.1)

## 2016-11-07 LAB — CULTURE, BLOOD (ROUTINE X 2)
Culture: NO GROWTH
Culture: NO GROWTH
Special Requests: ADEQUATE
Special Requests: ADEQUATE

## 2016-11-07 LAB — GLUCOSE, CAPILLARY
Glucose-Capillary: 109 mg/dL — ABNORMAL HIGH (ref 65–99)
Glucose-Capillary: 130 mg/dL — ABNORMAL HIGH (ref 65–99)

## 2016-11-07 LAB — MAGNESIUM: MAGNESIUM: 2 mg/dL (ref 1.7–2.4)

## 2016-11-07 MED ORDER — FUROSEMIDE 10 MG/ML IJ SOLN
60.0000 mg | Freq: Two times a day (BID) | INTRAMUSCULAR | Status: DC
  Administered 2016-11-07: 60 mg via INTRAVENOUS
  Filled 2016-11-07: qty 6

## 2016-11-07 MED ORDER — ONDANSETRON 8 MG PO TBDP: 8.0000 mg | ORAL_TABLET | Freq: Three times a day (TID) | ORAL | 0 refills | Status: DC | PRN

## 2016-11-07 MED ORDER — POTASSIUM CHLORIDE CRYS ER 20 MEQ PO TBCR
60.0000 meq | EXTENDED_RELEASE_TABLET | Freq: Once | ORAL | Status: AC
  Administered 2016-11-07: 60 meq via ORAL
  Filled 2016-11-07: qty 3

## 2016-11-07 MED ORDER — FUROSEMIDE 40 MG PO TABS: 40.0000 mg | ORAL_TABLET | Freq: Every day | ORAL | 0 refills | Status: DC

## 2016-11-07 MED ORDER — GUAIFENESIN-DM 100-10 MG/5ML PO SYRP: 5.0000 mL | ORAL_SOLUTION | ORAL | 0 refills | Status: DC | PRN

## 2016-11-07 MED ORDER — FLUCONAZOLE 200 MG PO TABS
200.0000 mg | ORAL_TABLET | Freq: Every day | ORAL | 0 refills | Status: DC
Start: 2016-11-07 — End: 2016-12-09

## 2016-11-07 MED ORDER — AMOXICILLIN 500 MG PO CAPS: 500.0000 mg | ORAL_CAPSULE | Freq: Three times a day (TID) | ORAL | 0 refills | Status: DC

## 2016-11-07 NOTE — Discharge Summary (Signed)
Physician Discharge Summary  ORRIE LASCANO JXB:[id number] DOB: [birth year] DOA: 11/02/2016  PCP: Patient, No Pcp Per  Admit date: 11/02/2016 Discharge date: 11/07/2016  Admitted From: Home Disposition:  Home  Recommendations for Outpatient Follow-up:  1. Follow up with oncologist/Dr. Wendee Beavers at earliest convenience 2. Please obtain BMP/CBC in one week and follow-up with Dr. 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 She was tachycardic and tachypneic, and admitted for febrile neutropenia with sepsis. Her condition improved. She was initially started on broad-spectrum antibiotics which was changed to oral amoxicillin. She was also started on oral Diflucan for probable esophageal candidiasis/pharyngitis. Neutropenia resolved. She feels better and wants to go home. She has a follow-up appointment with her oncologist on a 11/08/2016.  Discharge Diagnoses:  Principal Problem:   Sepsis ([place]) Active Problems:   Neutropenia with fever (H[facility])   Hypokalemia   Primary breast cancer with metastasis to other site [facility])   Pharyngitis, acute   Acute esophagitis   Acute deep vein thrombosis (DVT) of axillary vein of left upper extremity ([place])   Diabetes ([place])  1 Sepsis: Source unclear- ? Pharynx Resolved Status post ceftazidime and vancomycin; switched to oral amoxicillin on 11/05/2016.  urine culture: multiples bacterial morphotype.  Blood culture no growth to date.    2 febrile neutropenia: Resolved.  Patient had low-grade temperature on 11/06/2016. Cultures have been negative  so far except for urine culture growing multiple bacteria most likely contaminant. Patient is not having any urinary symptoms. Fever can be secondary to DVT, cancer, esophagitis  Continue with oral amoxicillin for 5 more days at home. - Follow up with oncologist tomorrow on 11/08/2016 patient has an appointment  3 Left Arm, DVT.  Reports ongoingedema.  Continue with Eliquis. CT shows diffuse edema. This can be as a result of lymphedema.  Patient has significant positive fluid balance since admission and was given a few doses of IV Lasix. Discharge home on oral Lasix 40 mg daily  4-acute esophagitis: Continue Diflucan for total of 10-14 days Patient might need outpatient GI evaluation and probable endoscopy if symptoms don't improve with Diflucan.  5 acute pharyngitis: Continue Amoxicillin for 5 more days on discharge Strep pneumonia negative.   6 protein calorie malnutrition: Due to malignancy Follow nutrition recommendations  7 nausea and vomiting: toleratimg soft diet. Continue Zofran when necessary for nausea  8 hypokalemia: Outpatient follow-up  9 pleural effusion: Likely related to metastatic disease Continue Lasix 40 mg daily. Outpatient follow-up  10 DM 2: Resume metformin. Outpatient follow-up  11-Transaminases - Probably secondary to liver metastasis. Outpatient follow-up Discharge Instructions  Discharge Instructions    Call MD for:  difficulty breathing, headache or visual disturbances    Complete by:  As directed    Call MD for:  extreme fatigue    Complete by:  As directed    Call MD for:  hives    Complete by:  As directed    Call MD for:  persistant dizziness or light-headedness    Complete by:  As directed    Call MD for:  persistant nausea and vomiting    Complete by:  As directed    Call  MD for:  severe uncontrolled pain    Complete by:  As directed    Call MD for:  temperature >100.4    Complete by:  As directed    Diet - low sodium  heart healthy    Complete by:  As directed    Diet Carb Modified    Complete by:  As directed    Increase activity slowly    Complete by:  As directed      Allergies as of 11/07/2016   No Known Allergies     Medication List    TAKE these medications   amoxicillin 500 MG capsule Commonly known as:  AMOXIL Take 1 capsule (500 mg total) by mouth every 8 (eight) hours.   ELIQUIS 5 MG Tabs tablet Generic drug:  apixaban Take 5 mg by mouth 2 (two) times daily.   fluconazole 200 MG tablet Commonly known as:  DIFLUCAN Take 1 tablet (200 mg total) by mouth daily.   furosemide 40 MG tablet Commonly known as:  LASIX Take 1 tablet (40 mg total) by mouth daily.   guaiFENesin-dextromethorphan 100-10 MG/5ML syrup Commonly known as:  ROBITUSSIN DM Take 5 mLs by mouth every 4 (four) hours as needed for cough.   HYDROmorphone 4 MG tablet Commonly known as:  DILAUDID Take 4 mg by mouth every 6 (six) hours as needed.   metFORMIN 500 MG 24 hr tablet Commonly known as:  GLUCOPHAGE-XR Take 500 mg by mouth daily.   ondansetron 8 MG disintegrating tablet Commonly known as:  ZOFRAN-ODT Take 8 mg by mouth every 8 (eight) hours as needed.       Follow-up Information    Dustin Folks, MD Follow up in 1 day(s).   Specialty:  Hematology and Oncology Why:  at earliest convenience Contact information: [address] [place] [address] [phone]          No Known Allergies  Consultations:  None   Procedures/Studies: Dg Chest 2 View  Result Date: 11/02/2016 CLINICAL DATA:  Sepsis.  Tachycardia.  Fever. EXAM: CHEST  2 VIEW COMPARISON:  09/08/2016 FINDINGS: Normal heart size. New bilateral pleural effusions, left greater than right. Mild diffuse Innumerable pulmonary nodules are identified in both lungs. Mild diffuse superimposed pulmonary edema noted. IMPRESSION: 1. Diffuse pulmonary metastasis as before. 2. Bilateral pleural effusions and mild interstitial edema  Electronically Signed   By: Kerby Moors M.D.   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 She was tachycardic and tachypneic, and admitted for febrile neutropenia with sepsis EXAM: ABDOMEN - 1 VIEW COMPARISON:  PET-CT 09/24/2016 FINDINGS: Normal bowel gas pattern. Bilateral pelvic phleboliths. Regional bones unremarkable. IMPRESSION: Negative. Electronically Signed   By: Lucrezia Europe M.D.   On: 11/03/2016 12:57   Ct Angio Chest Pe W And/or Wo Contrast  Result Date: 11/02/2016 CLINICAL DATA:  Tachycardia, metastatic cancer, hemoptysis, left upper extremity DVT. A true pain fever. Tachycardia. EXAM: CT ANGIOGRAPHY CHEST WITH CONTRAST TECHNIQUE: Multidetector CT imaging of the chest was performed using the standard protocol during bolus administration of intravenous contrast. Multiplanar CT image reconstructions and MIPs were obtained to evaluate the vascular anatomy. CONTRAST:  100 cc  Isovue 370. COMPARISON:  PET 09/24/2016 and CT chest 09/08/2016. FINDINGS: Cardiovascular: Image quality is degraded by suboptimal opacification of the segmental and subsegmental pulmonary arteries as well as respiratory motion. No central or lobar pulmonary embolus. Vascular structures are unremarkable. Heart is at the upper limits of normal in size to mildly enlarged. No pericardial effusion. Mediastinum/Nodes: Right lobe of the thyroid is enlarged and contains a heterogeneous nodule measuring 2.9 cm. There is slight leftward deviation of the trachea as well. No pathologically enlarged mediastinal, hilar or axillary lymph nodes. Surgical clips in both  axillary regions. Esophagus maybe slightly thickened. Lungs/Pleura: Hematogenously distributed pulmonary nodules are seen throughout the lungs bilaterally. Index nodule in the medial right lower lobe measures 11 mm (series 11, image 46), previously 10 mm. Scattered septal thickening is seen bilaterally, as before. Bilateral pleural effusions, moderate on the right and small on the left. Compressive atelectasis in both lower lobes. Airway is unremarkable. Upper Abdomen: Multiple ill-defined mildly hypoattenuating lesions are seen in the liver, measuring up to approximately 3.5 cm centrally, as before. Visualized portions of the adrenal glands, spleen and stomach are grossly unremarkable. Musculoskeletal: Sclerotic lesions are seen in the right humeral head and lower thoracic spine. Review of the MIP images confirms the above findings. IMPRESSION: 1. Segmental and subsegmental pulmonary arteries are poorly opacified, limiting evaluation. No central or lobar pulmonary embolus. 2. Pulmonary, hepatic and osseous metastatic disease, grossly stable. 3. Bilateral pleural effusions, right greater than left. 4. Right thyroid nodule, indeterminate. Electronically Signed   By: Lorin Picket M.D.   On: 11/02/2016 11:55   Ct Extrem Up Entire Arm L Wo/w Cm  Result Date: 11/06/2016 CLINICAL DATA:  Left upper extremity DVT with ongoing swelling and pain, concern for abscess. History of metastatic breast cancer. EXAM: CT OF THE UPPER LEFT EXTREMITY WITHOUT AND WITH CONTRAST TECHNIQUE: Multidetector CT imaging of the upper left extremity was performed following the standard protocol before and during bolus administration of intravenous contrast. COMPARISON:  None. CONTRAST:  158mL ISOVUE-300 IOPAMIDOL (ISOVUE-300) INJECTION 61% FINDINGS: Bones/Joint/Cartilage No acute osseous abnormality. No fracture dislocation. No discrete lytic or sclerotic lesions identified. The shoulder and elbow are grossly unremarkable. Ligaments  Suboptimally assessed by CT. Soft tissues There is diffuse subcutaneous edema from the mid upper arm extending into the hand. There is no drainable fluid collection. Normal muscle bulk. Poor opacification of the left subclavian, axillary, and brachial veins, better evaluated on recent left arm venous ultrasound. Surgical clips again noted in the left axilla. Left mastectomy with implant reconstruction. Partially visualized small left pleural effusion. Innumerable pulmonary nodules within the left lung, consistent with metastases. IMPRESSION: 1. Diffuse subcutaneous edema from the left mid upper arm extending into the hand. No drainable fluid collection. 2. Poor opacification of the left subclavian, axillary, in particular in the hands, better evaluated on recent left upper extremity venous ultrasound. 3. Partially visualized small left pleural effusion. 4. Partially visualized left lung nodules, consistent with metastases. Electronically Signed   By: Titus Dubin M.D.   On: 11/06/2016 08:28    Doppler results from 7-30 are as noted below: - Findings are suggestive of indeterminate age, non occlusive, deep vein thrombosis involving the left subclavian, axillary and brachial veins. These veins are non compressible and appear to have dampened monophasic flow. The basilic vein appears non compressible as well. - Incidental findings are consistent with: hypoechoic areas seen in left forearm, area of pain. the more inferior area measures 8.7 x 6.17mm, the more superior area  measures 7.5x 5.60mm.   Subjective: Patient seen and examined at bedside. She denies any overnight fever, nausea or vomiting. Her swallowing is much improved. No diarrhea. She feels better enough to go home  Discharge Exam: Vitals:   11/07/16 0216 11/07/16 0429  BP:  134/79  Pulse:  (!) 114  Resp:  16  Temp: 99.4 F (37.4 C) 98.2 F (36.8 C)   Vitals:   11/06/16 2025 11/07/16 0103 11/07/16 0216 11/07/16 0429   BP: 130/88   134/79  Pulse: (!) 115   (!) 114  Resp: 20   16  Temp: 99 F (37.2 C) 99 F (37.2 C) 99.4 F (37.4 C) 98.2 F (36.8 C)  TempSrc: Axillary Oral Oral Oral  SpO2: 96%   97%  Weight:    101.7 kg (224 lb 1.6 oz)  Height:        General: Pt is alert, awake, not in acute distress Cardiovascular: Slightly tachycardic, S1/S2 + Respiratory: Bilateral decreased breath sounds at bases  Abdominal: Soft, NT, ND, bowel sounds + Extremities: Left upper extremity with ongoing edema; no cyanosis    The results of significant diagnostics from this hospitalization (including imaging, microbiology, ancillary and laboratory) are listed below for reference.     Microbiology: Recent Results (from the past 240 hour(s))  Rapid strep screen (not at [facility])     Status: None   Collection Time: 11/02/16  8:32 AM  Result Value Ref Range Status   Streptococcus, Group A Screen (Direct) NEGATIVE NEGATIVE Final    Comment: (NOTE) A Rapid Antigen test may result negative if the antigen level in the sample is below the detection level of this test. The FDA has not cleared this test as a stand-alone test therefore the rapid antigen negative result has reflexed to a Group A Strep culture.   Culture, group A strep     Status: None   Collection Time: 11/02/16  8:32 AM  Result Value Ref Range Status   Specimen Description THROAT  Final   Special Requests NONE Reflexed from [id number]  Final   Culture ABUNDANT STREPTOCOCCUS,BETA HEMOLYTIC NOT GROUP A  Final   Report Status 11/04/2016 FINAL  Final  Culture, blood (Routine x 2)     Status: None (Preliminary result)   Collection Time: 11/02/16  8:42 AM  Result Value Ref Range Status   Specimen Description BLOOD RIGHT HAND  Final   Special Requests   Final    BOTTLES DRAWN AEROBIC AND ANAEROBIC Blood Culture adequate volume   Culture   Final    NO GROWTH 4 DAYS Performed at [facility], [address] [place], [place] [address]    Report  Status PENDING  Incomplete  Culture, blood (Routine x 2)     Status: None (Preliminary result)   Collection Time: 11/02/16  9:06 AM  Result Value Ref Range Status   Specimen Description BLOOD RIGHT ANTECUBITAL  Final   Special Requests   Final    BOTTLES DRAWN AEROBIC AND ANAEROBIC Blood Culture adequate volume   Culture   Final    NO GROWTH 4 DAYS Performed at [facility], [place] [address] [place], [place] [address]    Report Status PENDING  Incomplete  Fungus culture, blood     Status: None (Preliminary result)   Collection Time: 11/02/16  9:06 AM  Result Value Ref Range Status   Specimen Description BLOOD RIGHT HAND  Final   Special Requests Immunocompromised  Final   Culture  Final    NO GROWTH 4 DAYS Performed at [facility], [place] [address], [place], [place] [address]    Report Status PENDING  Incomplete  Urine Culture     Status: Abnormal   Collection Time: 11/02/16  9:57 AM  Result Value Ref Range Status   Specimen Description URINE, CLEAN CATCH  Final   Special Requests Immunocompromised  Final   Culture MULTIPLE SPECIES PRESENT, SUGGEST RECOLLECTION (A)  Final   Report Status 11/03/2016 FINAL  Final     Labs: BNP (last 3 results)  Recent Labs  11/04/16 0502  BNP 36.1   Basic Metabolic Panel:  Recent Labs Lab 11/03/16 0519 11/04/16 0502 11/05/16 0449 11/06/16 0510 11/07/16 0500  NA 144 143 146* 144 141  K 3.6 3.7 3.7 3.7 3.3*  CL 113* 114* 115* 111 109  CO2 23 22 23 27 24   GLUCOSE 102* 128* 114* 105* 107*  BUN 6 <5* 5* 7 8  CREATININE 0.75 0.72 0.76 0.78 0.84  CALCIUM 8.1* 8.3* 8.5* 8.5* 8.3*  MG  --   --   --   --  2.0   Liver Function Tests:  Recent Labs Lab 11/02/16 0842 11/03/16 0519 11/06/16 0510 11/07/16 0500  AST 60* 48* 69* 74*  ALT 34 29 25 25   ALKPHOS 192* 156* 292* 351*  BILITOT 3.9* 2.7* 1.7* 2.0*  PROT 7.0 6.5 5.9* 6.9  ALBUMIN 2.6* 2.2* 2.1* 2.4*   No results for input(s): LIPASE, AMYLASE in the last 168  hours. No results for input(s): AMMONIA in the last 168 hours. CBC:  Recent Labs Lab 11/02/16 0842 11/03/16 0519 11/04/16 0502 11/05/16 0449 11/06/16 0510 11/07/16 0500  WBC 1.0* 2.4* 6.2 11.0* 11.6* 15.3*  NEUTROABS 0.0* 0.2*  --  6.6 8.7* 11.1*  HGB 10.5* 9.2* 9.5* 9.3* 8.9* 9.7*  HCT 31.2* 28.0* 28.8* 28.5* 27.6* 30.0*  MCV 87.4 88.6 88.9 89.9 90.8 89.8  PLT 136* 168 212 222 201 205   Cardiac Enzymes: No results for input(s): CKTOTAL, CKMB, CKMBINDEX, TROPONINI in the last 168 hours. BNP: Invalid input(s): POCBNP CBG:  Recent Labs Lab 11/06/16 0738 11/06/16 1138 11/06/16 1645 11/06/16 2021 11/07/16 0752  GLUCAP 108* 106* 112* 108* 109*   D-Dimer No results for input(s): DDIMER in the last 72 hours. Hgb A1c No results for input(s): HGBA1C in the last 72 hours. Lipid Profile No results for input(s): CHOL, HDL, LDLCALC, TRIG, CHOLHDL, LDLDIRECT in the last 72 hours. Thyroid function studies No results for input(s): TSH, T4TOTAL, T3FREE, THYROIDAB in the last 72 hours.  Invalid input(s): FREET3 Anemia work up No results for input(s): VITAMINB12, FOLATE, FERRITIN, TIBC, IRON, RETICCTPCT in the last 72 hours. Urinalysis    Component Value Date/Time   COLORURINE YELLOW 11/02/2016 0957   APPEARANCEUR HAZY (A) 11/02/2016 0957   LABSPEC 1.004 (L) 11/02/2016 0957   PHURINE 6.0 11/02/2016 0957   GLUCOSEU NEGATIVE 11/02/2016 0957   HGBUR MODERATE (A) 11/02/2016 0957   BILIRUBINUR NEGATIVE 11/02/2016 0957   BILIRUBINUR neg 06/30/2012 1151   KETONESUR 5 (A) 11/02/2016 0957   PROTEINUR NEGATIVE 11/02/2016 0957   UROBILINOGEN 1.0 02/20/2014 2326   NITRITE NEGATIVE 11/02/2016 0957   LEUKOCYTESUR NEGATIVE 11/02/2016 0957   Sepsis Labs Invalid input(s): PROCALCITONIN,  WBC,  LACTICIDVEN Microbiology Recent Results (from the past 240 hour(s))  Rapid strep screen (not at [facility])     Status: None   Collection Time: 11/02/16  8:32 AM  Result Value Ref Range Status  Streptococcus, Group A Screen (Direct) NEGATIVE NEGATIVE Final    Comment: (NOTE) A Rapid Antigen test may result negative if the antigen level in the sample is below the detection level of this test. The FDA has not cleared this test as a stand-alone test therefore the rapid antigen negative result has reflexed to a Group A Strep culture.   Culture, group A strep     Status: None   Collection Time: 11/02/16  8:32 AM  Result Value Ref Range Status   Specimen Description THROAT  Final   Special Requests NONE Reflexed from [id number]  Final   Culture ABUNDANT STREPTOCOCCUS,BETA HEMOLYTIC NOT GROUP A  Final   Report Status 11/04/2016 FINAL  Final  Culture, blood (Routine x 2)     Status: None (Preliminary result)   Collection Time: 11/02/16  8:42 AM  Result Value Ref Range Status   Specimen Description BLOOD RIGHT HAND  Final   Special Requests   Final    BOTTLES DRAWN AEROBIC AND ANAEROBIC Blood Culture adequate volume   Culture   Final    NO GROWTH 4 DAYS Performed at [facility], [address] [place], [place] [address]    Report Status PENDING  Incomplete  Culture, blood (Routine x 2)     Status: None (Preliminary result)   Collection Time: 11/02/16  9:06 AM  Result Value Ref Range Status   Specimen Description BLOOD RIGHT ANTECUBITAL  Final   Special Requests   Final    BOTTLES DRAWN AEROBIC AND ANAEROBIC Blood Culture adequate volume   Culture   Final    NO GROWTH 4 DAYS Performed at [facility], [place] [address] [place], [place] [address]    Report Status PENDING  Incomplete  Fungus culture, blood     Status: None (Preliminary result)   Collection Time: 11/02/16  9:06 AM  Result Value Ref Range Status   Specimen Description BLOOD RIGHT HAND  Final   Special Requests Immunocompromised  Final   Culture   Final    NO GROWTH 4 DAYS Performed at [facility], [place] [address] [place], [place] [address]    Report Status PENDING  Incomplete  Urine Culture      Status: Abnormal   Collection Time: 11/02/16  9:57 AM  Result Value Ref Range Status   Specimen Description URINE, CLEAN CATCH  Final   Special Requests Immunocompromised  Final   Culture MULTIPLE SPECIES PRESENT, SUGGEST RECOLLECTION (A)  Final   Report Status 11/03/2016 FINAL  Final     Time coordinating discharge: 35 minutes  SIGNED:   Aline August, MD  [facility] 11/07/2016, 10:02 AM Pager: [phone]  If 7PM-7AM, please contact night-coverage [email or web address] Password [email or web address]

## 2016-11-07 NOTE — Care Management Note (Signed)
Case Management Note  Patient Details  Name: Rita Frazier MRN: [id number] Date of Birth: [birth year]  Subjective/Objective:                    Action/Plan:dc home.   Expected Discharge Date:  11/07/16               Expected Discharge Plan:  Home/Self Care  In-House Referral:     Discharge planning Services  CM Consult  Post Acute Care Choice:    Choice offered to:     DME Arranged:    DME Agency:     HH Arranged:    HH Agency:     Status of Service:  Completed, signed off  If discussed at [employer] of Stay Meetings, dates discussed:    Additional Comments:  Dessa Phi, RN 11/07/2016, 10:13 AM

## 2016-11-09 LAB — FUNGUS CULTURE, BLOOD: Culture: NO GROWTH

## 2016-12-08 ENCOUNTER — Encounter (HOSPITAL_COMMUNITY): Payer: Self-pay | Admitting: Emergency Medicine

## 2016-12-08 ENCOUNTER — Inpatient Hospital Stay (HOSPITAL_COMMUNITY)
Admission: EM | Admit: 2016-12-08 | Discharge: 2016-12-13 | DRG: 871 | Disposition: A | Discharge status: Hospice | Payer: 59 | Attending: Internal Medicine | Admitting: Internal Medicine

## 2016-12-08 ENCOUNTER — Emergency Department (HOSPITAL_COMMUNITY): Payer: 59

## 2016-12-08 DIAGNOSIS — K209 Esophagitis, unspecified without bleeding: Secondary | ICD-10-CM | POA: Diagnosis present

## 2016-12-08 DIAGNOSIS — D709 Neutropenia, unspecified: Secondary | ICD-10-CM | POA: Diagnosis present

## 2016-12-08 DIAGNOSIS — C799 Secondary malignant neoplasm of unspecified site: Secondary | ICD-10-CM

## 2016-12-08 DIAGNOSIS — C50919 Malignant neoplasm of unspecified site of unspecified female breast: Secondary | ICD-10-CM | POA: Diagnosis present

## 2016-12-08 DIAGNOSIS — R1310 Dysphagia, unspecified: Secondary | ICD-10-CM | POA: Diagnosis present

## 2016-12-08 DIAGNOSIS — D61810 Antineoplastic chemotherapy induced pancytopenia: Secondary | ICD-10-CM | POA: Diagnosis present

## 2016-12-08 DIAGNOSIS — D701 Agranulocytosis secondary to cancer chemotherapy: Secondary | ICD-10-CM | POA: Diagnosis present

## 2016-12-08 DIAGNOSIS — A419 Sepsis, unspecified organism: Principal | ICD-10-CM | POA: Diagnosis present

## 2016-12-08 DIAGNOSIS — E43 Unspecified severe protein-calorie malnutrition: Secondary | ICD-10-CM | POA: Diagnosis present

## 2016-12-08 DIAGNOSIS — Z7901 Long term (current) use of anticoagulants: Secondary | ICD-10-CM

## 2016-12-08 DIAGNOSIS — Z86718 Personal history of other venous thrombosis and embolism: Secondary | ICD-10-CM

## 2016-12-08 DIAGNOSIS — Z66 Do not resuscitate: Secondary | ICD-10-CM | POA: Diagnosis present

## 2016-12-08 DIAGNOSIS — Z9013 Acquired absence of bilateral breasts and nipples: Secondary | ICD-10-CM

## 2016-12-08 DIAGNOSIS — R112 Nausea with vomiting, unspecified: Secondary | ICD-10-CM

## 2016-12-08 DIAGNOSIS — R5081 Fever presenting with conditions classified elsewhere: Secondary | ICD-10-CM | POA: Diagnosis present

## 2016-12-08 DIAGNOSIS — Z6829 Body mass index (BMI) 29.0-29.9, adult: Secondary | ICD-10-CM

## 2016-12-08 DIAGNOSIS — Z885 Allergy status to narcotic agent status: Secondary | ICD-10-CM

## 2016-12-08 DIAGNOSIS — R188 Other ascites: Secondary | ICD-10-CM | POA: Diagnosis present

## 2016-12-08 DIAGNOSIS — C7951 Secondary malignant neoplasm of bone: Secondary | ICD-10-CM | POA: Diagnosis present

## 2016-12-08 DIAGNOSIS — R627 Adult failure to thrive: Secondary | ICD-10-CM | POA: Diagnosis present

## 2016-12-08 DIAGNOSIS — I82622 Acute embolism and thrombosis of deep veins of left upper extremity: Secondary | ICD-10-CM | POA: Diagnosis present

## 2016-12-08 DIAGNOSIS — E119 Type 2 diabetes mellitus without complications: Secondary | ICD-10-CM

## 2016-12-08 DIAGNOSIS — C7800 Secondary malignant neoplasm of unspecified lung: Secondary | ICD-10-CM | POA: Diagnosis present

## 2016-12-08 DIAGNOSIS — T451X5A Adverse effect of antineoplastic and immunosuppressive drugs, initial encounter: Secondary | ICD-10-CM | POA: Diagnosis present

## 2016-12-08 DIAGNOSIS — Z515 Encounter for palliative care: Secondary | ICD-10-CM

## 2016-12-08 DIAGNOSIS — E860 Dehydration: Secondary | ICD-10-CM | POA: Diagnosis present

## 2016-12-08 DIAGNOSIS — D696 Thrombocytopenia, unspecified: Secondary | ICD-10-CM | POA: Diagnosis present

## 2016-12-08 DIAGNOSIS — C787 Secondary malignant neoplasm of liver and intrahepatic bile duct: Secondary | ICD-10-CM | POA: Diagnosis present

## 2016-12-08 LAB — COMPREHENSIVE METABOLIC PANEL
ALT: 64 U/L — ABNORMAL HIGH (ref 14–54)
AST: 252 U/L — ABNORMAL HIGH (ref 15–41)
Albumin: 2.1 g/dL — ABNORMAL LOW (ref 3.5–5.0)
Alkaline Phosphatase: 362 U/L — ABNORMAL HIGH (ref 38–126)
Anion gap: 11 (ref 5–15)
BUN: 11 mg/dL (ref 6–20)
CHLORIDE: 102 mmol/L (ref 101–111)
CO2: 23 mmol/L (ref 22–32)
Calcium: 8.6 mg/dL — ABNORMAL LOW (ref 8.9–10.3)
Creatinine, Ser: 0.57 mg/dL (ref 0.44–1.00)
GFR calc Af Amer: >60 mL/min (ref >60)
Glucose, Bld: 129 mg/dL — ABNORMAL HIGH (ref 65–99)
POTASSIUM: 3.5 mmol/L (ref 3.5–5.1)
Sodium: 136 mmol/L (ref 135–145)
Total Bilirubin: 7.6 mg/dL — ABNORMAL HIGH (ref 0.3–1.2)
Total Protein: 7.3 g/dL (ref 6.5–8.1)

## 2016-12-08 LAB — CBC WITH DIFFERENTIAL/PLATELET
BASOS PCT: 1 %
Basophils Absolute: 0 10*3/uL (ref 0.0–0.1)
EOS PCT: 0 %
Eosinophils Absolute: 0 10*3/uL (ref 0.0–0.7)
HEMATOCRIT: 28.9 % — AB (ref 36.0–46.0)
Hemoglobin: 9.7 g/dL — ABNORMAL LOW (ref 12.0–15.0)
LYMPHS PCT: 45 %
Lymphs Abs: 0.4 10*3/uL — ABNORMAL LOW (ref 0.7–4.0)
MCH: 29.3 pg (ref 26.0–34.0)
MCHC: 33.6 g/dL (ref 30.0–36.0)
MCV: 87.3 fL (ref 78.0–100.0)
Monocytes Absolute: 0 10*3/uL — ABNORMAL LOW (ref 0.1–1.0)
Monocytes Relative: 3 %
NEUTROS ABS: 0.5 10*3/uL — AB (ref 1.7–7.7)
Neutrophils Relative %: 51 %
Platelets: 53 10*3/uL — ABNORMAL LOW (ref 150–400)
RBC: 3.31 MIL/uL — ABNORMAL LOW (ref 3.87–5.11)
RDW: 18.1 % — AB (ref 11.5–15.5)
WBC: 0.9 10*3/uL — CL (ref 4.0–10.5)

## 2016-12-08 LAB — I-STAT CG4 LACTIC ACID, ED: LACTIC ACID, VENOUS: 2.33 mmol/L — AB (ref 0.5–1.9)

## 2016-12-08 LAB — PROTIME-INR
INR: 2.71
PROTHROMBIN TIME: 28.6 s — AB (ref 11.4–15.2)

## 2016-12-08 MED ORDER — PROMETHAZINE HCL 25 MG/ML IJ SOLN
25.0000 mg | Freq: Once | INTRAMUSCULAR | Status: AC
  Administered 2016-12-08: 25 mg via INTRAVENOUS
  Filled 2016-12-08: qty 1

## 2016-12-08 MED ORDER — SODIUM CHLORIDE 0.9 % IV BOLUS (SEPSIS)
1000.0000 mL | Freq: Once | INTRAVENOUS | Status: AC
  Administered 2016-12-08: 1000 mL via INTRAVENOUS

## 2016-12-08 NOTE — ED Triage Notes (Signed)
Pt family reports that she has been having vomiting and is a stage 4 breast cancer. Last chemo treatment was 12/04/16.

## 2016-12-08 NOTE — ED Provider Notes (Addendum)
[facility] DEPT Provider Note   CSN: [id number] Arrival date & time: 12/08/16  2102     History   Chief Complaint Chief Complaint  Patient presents with  . Emesis  . Chemotherapy    HPI Rita Frazier is a 41 y.o. female.  HPI Patient has metastatic breast cancer. Stage IV. On Wednesday she had new chemotherapy regimen. Now has had nausea and vomiting. No diarrhea but states the stool was not was formed. Has had increasing jaundice recently also. Has been seen with an ultrasound and thought it was just her liver metastasis. Has not really been hungry.. Little oral intake. No fevers. No chest pain. No cough. Does have some dull upper abdominal pain. Past Medical History:  Diagnosis Date  . Anemia   . Cancer ([place])    breast  . Diabetes mellitus, type 2 [facility])     Patient Active Problem List   Diagnosis Date Noted  . Diabetes ([place]) 11/07/2016  . Neutropenia with fever ([place]) 11/02/2016    Class: Acute  . Sepsis ([place]) 11/02/2016    Class: Acute  . Hypokalemia 11/02/2016    Class: Acute  . Primary breast cancer with metastasis to other site [facility]) 11/02/2016    Class: Acute  . Pharyngitis, acute 11/02/2016    Class: Acute  . Acute esophagitis 11/02/2016    Class: Acute  . Acute deep vein thrombosis (DVT) of axillary vein of left upper extremity ([place]) 11/02/2016    Class: Acute  . Routine gynecological examination 07/02/2012  . Excessive or frequent menstruation 07/02/2012  . Vaginitis and vulvovaginitis, unspecified 07/02/2012  . Excessive or frequent menstruation 07/02/2012  . Screening for malignant neoplasm of the cervix 07/02/2012    Past Surgical History:  Procedure Laterality Date  . ANKLE SURGERY    . BREAST LUMPECTOMY    . FRACTURE SURGERY    . PORTACATH PLACEMENT      OB History    No data available       Home Medications    Prior to Admission medications   Medication Sig Start Date End Date Taking? Authorizing Provider  ELIQUIS 5 MG TABS  tablet Take 5 mg by mouth 2 (two) times daily. 10/21/16  Yes [provider]  guaiFENesin-dextromethorphan (ROBITUSSIN DM) 100-10 MG/5ML syrup Take 5 mLs by mouth every 4 (four) hours as needed for cough. 11/07/16  Yes Aline August, MD  ondansetron (ZOFRAN-ODT) 8 MG disintegrating tablet Take 1 tablet (8 mg total) by mouth every 8 (eight) hours as needed. Patient taking differently: Take 8 mg by mouth every 8 (eight) hours as needed for nausea or vomiting.  11/07/16  Yes Aline August, MD  prochlorperazine (COMPAZINE) 10 MG tablet Take 10 mg by mouth every 8 (eight) hours as needed for nausea or vomiting.  11/21/16  Yes [provider]  zolpidem (AMBIEN) 5 MG tablet Take 5 mg by mouth at bedtime as needed for sleep.  11/19/16  Yes [provider]  amoxicillin (AMOXIL) 500 MG capsule Take 1 capsule (500 mg total) by mouth every 8 (eight) hours. Patient not taking: Reported on 12/08/2016 11/07/16   Aline August, MD  fluconazole (DIFLUCAN) 200 MG tablet Take 1 tablet (200 mg total) by mouth daily. Patient not taking: Reported on 12/08/2016 11/07/16   Aline August, MD  furosemide (LASIX) 40 MG tablet Take 1 tablet (40 mg total) by mouth daily. Patient not taking: Reported on 12/08/2016 11/07/16 11/07/17  Aline August, MD    Family History Family History  Problem Relation Age of Onset  . Cancer Mother   . Cancer Sister     Social History Social History  Substance Use Topics  . Smoking status: Never Smoker  . Smokeless tobacco: Never Used  . Alcohol use No     Allergies   Hydromorphone   Review of Systems Review of Systems  Constitutional: Positive for appetite change and fatigue.  HENT: Negative for congestion.   Eyes: Negative for photophobia.  Respiratory: Positive for shortness of breath.   Cardiovascular: Negative for chest pain.  Gastrointestinal: Positive for abdominal pain, nausea and vomiting.  Genitourinary: Negative for dysuria.  Musculoskeletal:  Negative for back pain.  Skin: Negative for rash.  Neurological: Negative for seizures and numbness.  Hematological: Negative for adenopathy.  Psychiatric/Behavioral: Negative for confusion.     Physical Exam Updated Vital Signs BP 128/85   Pulse (!) 118   Temp 98.4 F (36.9 C) (Oral)   Resp (!) 27   Ht 5\' 10"  (1.778 m)   Wt 91.6 kg (202 lb)   SpO2 91%   BMI 28.98 kg/m   Physical Exam  Constitutional: She appears well-developed.  HENT:  Head: Normocephalic.  Eyes: Scleral icterus is present.  Neck: Neck supple.  Cardiovascular:  Tachycardia  Pulmonary/Chest: Effort normal.  Abdominal: Soft. There is tenderness.  Upper abdominal tenderness without rebound or guarding  Musculoskeletal: She exhibits no edema.  Neurological: She is alert.  Skin: Skin is warm.     ED Treatments / Results  Labs (all labs ordered are listed, but only abnormal results are displayed) Labs Reviewed  CBC WITH DIFFERENTIAL/PLATELET - Abnormal; Notable for the following:       Result Value   WBC 0.9 (*)    RBC 3.31 (*)    Hemoglobin 9.7 (*)    HCT 28.9 (*)    RDW 18.1 (*)    Platelets 53 (*)    All other components within normal limits  PROTIME-INR - Abnormal; Notable for the following:    Prothrombin Time 28.6 (*)    All other components within normal limits  I-STAT CG4 LACTIC ACID, ED - Abnormal; Notable for the following:    Lactic Acid, Venous 2.33 (*)    All other components within normal limits  CULTURE, BLOOD (ROUTINE X 2)  CULTURE, BLOOD (ROUTINE X 2)  COMPREHENSIVE METABOLIC PANEL  URINALYSIS, ROUTINE W REFLEX MICROSCOPIC    EKG  EKG Interpretation None       Radiology Dg Chest 2 View  Result Date: 12/08/2016 CLINICAL DATA:  Stage IV breast cancer with last chemotherapy treatment 12/04/2016. Vomiting. EXAM: CHEST  2 VIEW COMPARISON:  CXR from 11/02/2016, chest CT 11/02/2016 FINDINGS: Low lung volumes with slight increase in moderate left effusion and stable small  right effusion. Mild pulmonary vascular engorgement is seen with fluid in the left major fissure. Scattered pulmonary nodules are noted of the aerated lungs consistent metastatic disease. 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 Heart and mediastinal contours are stable. IMPRESSION: 1. Slightly larger left moderate pleural effusion with fluid extending into the major fissure since prior comparison studies. Stable small right pleural effusion. Mild pulmonary vascular congestion. 2. Pulmonary and osseous metastatic disease again noted, chronic in appearance. Electronically Signed   By: Ashley Royalty M.D.   On: 12/08/2016 22:43    Procedures Procedures (including critical care time)  Medications Ordered in ED Medications  sodium chloride 0.9 % bolus 1,000 mL (0 mLs Intravenous Stopped 12/08/16 2337)  sodium chloride 0.9 %  bolus 1,000 mL (1,000 mLs Intravenous New Bag/Given 12/08/16 2237)  promethazine (PHENERGAN) injection 25 mg (25 mg Intravenous Given 12/08/16 2239)     Initial Impression / Assessment and Plan / ED Course  I have reviewed the triage vital signs and the nursing notes.  Pertinent labs & imaging results that were available during my care of the patient were reviewed by me and considered in my medical decision making (see chart for details).     Patient with nausea vomiting. On chemotherapy and had last infusion 4 days ago. Afebrile. Does have some upper abdominal tenderness.  Final Clinical Impressions(s) / ED Diagnoses   Final diagnoses:  Non-intractable vomiting with nausea, unspecified vomiting type  Metastatic cancer [facility])    New Prescriptions New Prescriptions   No medications on file     Davonna Belling, MD 12/08/16 2340  Lab work shows neutropenia. LFTs somewhat more elevated. Patient still has not really been able to tolerate orals but has not vomited. His had 2 L and still has a tachycardia of around 116. I think he should benefit from an  observation  for continued hydration.   Davonna Belling, MD 12/09/16 [phone]

## 2016-12-09 ENCOUNTER — Encounter (HOSPITAL_COMMUNITY): Payer: Self-pay | Admitting: Internal Medicine

## 2016-12-09 DIAGNOSIS — C7800 Secondary malignant neoplasm of unspecified lung: Secondary | ICD-10-CM | POA: Diagnosis present

## 2016-12-09 DIAGNOSIS — R627 Adult failure to thrive: Secondary | ICD-10-CM | POA: Diagnosis present

## 2016-12-09 DIAGNOSIS — R1310 Dysphagia, unspecified: Secondary | ICD-10-CM | POA: Diagnosis present

## 2016-12-09 DIAGNOSIS — D709 Neutropenia, unspecified: Secondary | ICD-10-CM | POA: Diagnosis present

## 2016-12-09 DIAGNOSIS — E860 Dehydration: Secondary | ICD-10-CM | POA: Diagnosis not present

## 2016-12-09 DIAGNOSIS — T451X5A Adverse effect of antineoplastic and immunosuppressive drugs, initial encounter: Secondary | ICD-10-CM | POA: Diagnosis not present

## 2016-12-09 DIAGNOSIS — Z66 Do not resuscitate: Secondary | ICD-10-CM | POA: Diagnosis present

## 2016-12-09 DIAGNOSIS — A419 Sepsis, unspecified organism: Secondary | ICD-10-CM | POA: Diagnosis present

## 2016-12-09 DIAGNOSIS — E119 Type 2 diabetes mellitus without complications: Secondary | ICD-10-CM | POA: Diagnosis present

## 2016-12-09 DIAGNOSIS — C799 Secondary malignant neoplasm of unspecified site: Secondary | ICD-10-CM | POA: Diagnosis not present

## 2016-12-09 DIAGNOSIS — C787 Secondary malignant neoplasm of liver and intrahepatic bile duct: Secondary | ICD-10-CM | POA: Diagnosis present

## 2016-12-09 DIAGNOSIS — R5081 Fever presenting with conditions classified elsewhere: Secondary | ICD-10-CM | POA: Diagnosis present

## 2016-12-09 DIAGNOSIS — R112 Nausea with vomiting, unspecified: Secondary | ICD-10-CM | POA: Diagnosis not present

## 2016-12-09 DIAGNOSIS — D701 Agranulocytosis secondary to cancer chemotherapy: Secondary | ICD-10-CM | POA: Diagnosis not present

## 2016-12-09 DIAGNOSIS — Z885 Allergy status to narcotic agent status: Secondary | ICD-10-CM | POA: Diagnosis not present

## 2016-12-09 DIAGNOSIS — D61810 Antineoplastic chemotherapy induced pancytopenia: Secondary | ICD-10-CM | POA: Diagnosis present

## 2016-12-09 DIAGNOSIS — E43 Unspecified severe protein-calorie malnutrition: Secondary | ICD-10-CM | POA: Diagnosis present

## 2016-12-09 DIAGNOSIS — I82622 Acute embolism and thrombosis of deep veins of left upper extremity: Secondary | ICD-10-CM | POA: Diagnosis present

## 2016-12-09 DIAGNOSIS — D649 Anemia, unspecified: Secondary | ICD-10-CM | POA: Diagnosis not present

## 2016-12-09 DIAGNOSIS — Z9013 Acquired absence of bilateral breasts and nipples: Secondary | ICD-10-CM | POA: Diagnosis not present

## 2016-12-09 DIAGNOSIS — Z515 Encounter for palliative care: Secondary | ICD-10-CM | POA: Diagnosis not present

## 2016-12-09 DIAGNOSIS — K209 Esophagitis, unspecified: Secondary | ICD-10-CM | POA: Diagnosis present

## 2016-12-09 DIAGNOSIS — Z7189 Other specified counseling: Secondary | ICD-10-CM | POA: Diagnosis not present

## 2016-12-09 DIAGNOSIS — Z6829 Body mass index (BMI) 29.0-29.9, adult: Secondary | ICD-10-CM | POA: Diagnosis not present

## 2016-12-09 DIAGNOSIS — I82722 Chronic embolism and thrombosis of deep veins of left upper extremity: Secondary | ICD-10-CM | POA: Diagnosis not present

## 2016-12-09 DIAGNOSIS — Z86718 Personal history of other venous thrombosis and embolism: Secondary | ICD-10-CM | POA: Diagnosis not present

## 2016-12-09 DIAGNOSIS — C7951 Secondary malignant neoplasm of bone: Secondary | ICD-10-CM | POA: Diagnosis present

## 2016-12-09 DIAGNOSIS — R188 Other ascites: Secondary | ICD-10-CM | POA: Diagnosis present

## 2016-12-09 DIAGNOSIS — D696 Thrombocytopenia, unspecified: Secondary | ICD-10-CM | POA: Diagnosis present

## 2016-12-09 DIAGNOSIS — C50919 Malignant neoplasm of unspecified site of unspecified female breast: Secondary | ICD-10-CM | POA: Diagnosis present

## 2016-12-09 LAB — CBC
HCT: 26.4 % — ABNORMAL LOW (ref 36.0–46.0)
Hemoglobin: 8.8 g/dL — ABNORMAL LOW (ref 12.0–15.0)
MCH: 29.3 pg (ref 26.0–34.0)
MCHC: 33.3 g/dL (ref 30.0–36.0)
MCV: 88 fL (ref 78.0–100.0)
PLATELETS: 59 10*3/uL — AB (ref 150–400)
RBC: 3 MIL/uL — AB (ref 3.87–5.11)
RDW: 18.5 % — ABNORMAL HIGH (ref 11.5–15.5)
WBC: 0.6 10*3/uL — CL (ref 4.0–10.5)

## 2016-12-09 LAB — PROCALCITONIN: PROCALCITONIN: 1.69 ng/mL

## 2016-12-09 LAB — I-STAT CG4 LACTIC ACID, ED: Lactic Acid, Venous: 2.04 mmol/L (ref 0.5–1.9)

## 2016-12-09 LAB — LACTIC ACID, PLASMA: LACTIC ACID, VENOUS: 1.7 mmol/L (ref 0.5–1.9)

## 2016-12-09 LAB — LIPASE, BLOOD: Lipase: 27 U/L (ref 11–51)

## 2016-12-09 MED ORDER — ZOLPIDEM TARTRATE 5 MG PO TABS: 5.0000 mg | ORAL_TABLET | Freq: Every evening | ORAL | Status: DC | PRN

## 2016-12-09 MED ORDER — BOOST / RESOURCE BREEZE PO LIQD
1.0000 | Freq: Two times a day (BID) | ORAL | Status: DC
  Administered 2016-12-10: 1 via ORAL

## 2016-12-09 MED ORDER — DEXTROSE 5 % IV SOLN
2.0000 g | Freq: Three times a day (TID) | INTRAVENOUS | Status: DC
  Administered 2016-12-09 – 2016-12-13 (×14): 2 g via INTRAVENOUS
  Filled 2016-12-09 (×15): qty 2

## 2016-12-09 MED ORDER — ONDANSETRON HCL 4 MG/2ML IJ SOLN
4.0000 mg | Freq: Four times a day (QID) | INTRAMUSCULAR | Status: DC | PRN
  Administered 2016-12-09 (×2): 4 mg via INTRAVENOUS
  Filled 2016-12-09 (×2): qty 2

## 2016-12-09 MED ORDER — VANCOMYCIN HCL IN DEXTROSE 1-5 GM/200ML-% IV SOLN
1000.0000 mg | Freq: Three times a day (TID) | INTRAVENOUS | Status: DC
  Administered 2016-12-09 – 2016-12-11 (×6): 1000 mg via INTRAVENOUS
  Filled 2016-12-09 (×6): qty 200

## 2016-12-09 MED ORDER — GUAIFENESIN 100 MG/5ML PO SOLN
5.0000 mL | ORAL | Status: DC | PRN
  Administered 2016-12-09 – 2016-12-12 (×5): 100 mg via ORAL
  Filled 2016-12-09: qty 20
  Filled 2016-12-09 (×6): qty 10

## 2016-12-09 MED ORDER — FLUCONAZOLE IN SODIUM CHLORIDE 200-0.9 MG/100ML-% IV SOLN
200.0000 mg | Freq: Once | INTRAVENOUS | Status: AC
  Administered 2016-12-09: 200 mg via INTRAVENOUS
  Filled 2016-12-09: qty 100

## 2016-12-09 MED ORDER — ORAL CARE MOUTH RINSE
15.0000 mL | Freq: Two times a day (BID) | OROMUCOSAL | Status: DC
  Administered 2016-12-09 – 2016-12-12 (×5): 15 mL via OROMUCOSAL

## 2016-12-09 MED ORDER — LORAZEPAM 2 MG/ML IJ SOLN
0.5000 mg | INTRAMUSCULAR | Status: DC | PRN
  Administered 2016-12-09 – 2016-12-13 (×7): 1 mg via INTRAVENOUS
  Filled 2016-12-09 (×7): qty 1

## 2016-12-09 MED ORDER — ADULT MULTIVITAMIN W/MINERALS CH: 1.0000 | ORAL_TABLET | Freq: Every day | ORAL | Status: DC

## 2016-12-09 MED ORDER — BISMUTH SUBSALICYLATE 262 MG/15ML PO SUSP
30.0000 mL | ORAL | Status: DC | PRN
  Filled 2016-12-09: qty 236

## 2016-12-09 MED ORDER — ACETAMINOPHEN 325 MG PO TABS: 650.0000 mg | ORAL_TABLET | Freq: Four times a day (QID) | ORAL | Status: DC | PRN

## 2016-12-09 MED ORDER — APIXABAN 5 MG PO TABS: 5.0000 mg | ORAL_TABLET | Freq: Two times a day (BID) | ORAL | Status: DC

## 2016-12-09 MED ORDER — MAGIC MOUTHWASH
10.0000 mL | Freq: Three times a day (TID) | ORAL | Status: DC | PRN
  Administered 2016-12-09 – 2016-12-12 (×7): 10 mL via ORAL
  Filled 2016-12-09 (×7): qty 10

## 2016-12-09 MED ORDER — LIDOCAINE VISCOUS 2 % MT SOLN
15.0000 mL | OROMUCOSAL | Status: DC | PRN
  Administered 2016-12-09 – 2016-12-10 (×2): 15 mL via OROMUCOSAL
  Filled 2016-12-09 (×4): qty 15

## 2016-12-09 MED ORDER — FLUCONAZOLE 100MG IVPB
100.0000 mg | INTRAVENOUS | Status: DC
  Administered 2016-12-09 – 2016-12-12 (×4): 100 mg via INTRAVENOUS
  Filled 2016-12-09 (×5): qty 50

## 2016-12-09 MED ORDER — ACETAMINOPHEN 650 MG RE SUPP: 650.0000 mg | Freq: Four times a day (QID) | RECTAL | Status: DC | PRN

## 2016-12-09 MED ORDER — ADULT MULTIVITAMIN LIQUID CH
15.0000 mL | Freq: Every day | ORAL | Status: DC
  Administered 2016-12-10 – 2016-12-13 (×2): 15 mL via ORAL
  Filled 2016-12-09 (×5): qty 15

## 2016-12-09 MED ORDER — VANCOMYCIN HCL IN DEXTROSE 1-5 GM/200ML-% IV SOLN
1000.0000 mg | Freq: Once | INTRAVENOUS | Status: AC
  Administered 2016-12-09: 1000 mg via INTRAVENOUS
  Filled 2016-12-09: qty 200

## 2016-12-09 MED ORDER — SODIUM CHLORIDE 0.9% FLUSH
3.0000 mL | Freq: Two times a day (BID) | INTRAVENOUS | Status: DC
  Administered 2016-12-09 – 2016-12-10 (×4): 3 mL via INTRAVENOUS

## 2016-12-09 MED ORDER — MORPHINE SULFATE (PF) 2 MG/ML IV SOLN
2.0000 mg | INTRAVENOUS | Status: DC | PRN
  Administered 2016-12-09 – 2016-12-11 (×9): 2 mg via INTRAVENOUS
  Filled 2016-12-09 (×10): qty 1

## 2016-12-09 MED ORDER — CHLORHEXIDINE GLUCONATE 0.12 % MT SOLN
15.0000 mL | Freq: Two times a day (BID) | OROMUCOSAL | Status: DC
  Administered 2016-12-09 – 2016-12-12 (×7): 15 mL via OROMUCOSAL
  Filled 2016-12-09 (×9): qty 15

## 2016-12-09 MED ORDER — SODIUM CHLORIDE 0.9 % IV SOLN
INTRAVENOUS | Status: DC
  Administered 2016-12-09 – 2016-12-10 (×4): via INTRAVENOUS

## 2016-12-09 MED ORDER — FLUCONAZOLE IN SODIUM CHLORIDE 100-0.9 MG/50ML-% IV SOLN
100.0000 mg | INTRAVENOUS | Status: DC
  Filled 2016-12-09: qty 50

## 2016-12-09 MED ORDER — ONDANSETRON HCL 4 MG PO TABS: 4.0000 mg | ORAL_TABLET | Freq: Four times a day (QID) | ORAL | Status: DC | PRN

## 2016-12-09 MED ORDER — PROCHLORPERAZINE MALEATE 10 MG PO TABS: 10.0000 mg | ORAL_TABLET | Freq: Three times a day (TID) | ORAL | Status: DC | PRN

## 2016-12-09 NOTE — ED Notes (Signed)
Notified EDP,Horton,MD., pt. I-stat CG4 Lactic acid 2.04 and RN,Merle made aware.

## 2016-12-09 NOTE — Progress Notes (Signed)
  PROGRESS NOTE  Patient admitted earlier this morning. See H&P. 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 She states that her chemotherapy regimen was changed and since then she has been quite ill. She has had very poor oral intake, worsening nausea, vomiting. She denies any fevers or chills. She also admits to hoarseness and dysphagia. Patient was observed in the hospital secondary to dehydration.   She is neutropenic, but without fever. Treating empirically with cefepime and vancomycin until culture results. Continue IV hydration Palliative care consult is pending Diflucan given for esophagitis Monitor CBC closely in setting of thrombocytopenia on Eliquis   Dessa Phi, DO [facility] [email or web address] Password [email or web address] 12/09/2016, 11:41 AM

## 2016-12-09 NOTE — Progress Notes (Signed)
Pharmacy Antibiotic Note  Rita Frazier is a 41 y.o. 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  Pharmacy has been consulted for cefepime and vancomycin dosing.  Plan: Cefepime 2 Gm IV q8h Vancomycin 1 Gm IV q8h VT=15-20 mg/L F/u scr/cultures  Height: 5\' 10"  (177.8 cm) Weight: 202 lb (91.6 kg) IBW/kg (Calculated) : 68.5  Temp (24hrs), Avg:98.4 F (36.9 C), Min:98.4 F (36.9 C), Max:98.4 F (36.9 C)   Recent Labs Lab 12/08/16 2234 12/08/16 2246 12/09/16 0102  WBC 0.9*  --   --   CREATININE 0.57  --   --   LATICACIDVEN  --  2.33* 2.04*    Estimated Creatinine Clearance: 115.8 mL/min (by C-G formula based on SCr of 0.57 mg/dL).    Allergies  Allergen Reactions  . Hydromorphone Nausea And Vomiting    Antimicrobials this admission: 9/3 cefepime >>  9/3 fluconazole >>  9/3 vancomycin >>  Dose adjustments this admission:   Microbiology results:  BCx:   UCx:    Sputum:    MRSA PCR:   Thank you for allowing pharmacy to be a part of this patient's care.  Dorrene German 12/09/2016 1:57 AM

## 2016-12-09 NOTE — ED Notes (Signed)
Please call report to Sonia Side, [phone] at [phone]

## 2016-12-09 NOTE — Plan of Care (Signed)
Problem: Safety: Goal: Ability to remain free from injury will improve Outcome: Completed/Met Date Met: 12/09/16 Patient is aware to call when needing to get out of bed. Bed alarm is set when pt is alone in room. Pt / family verbalized understanding

## 2016-12-09 NOTE — Progress Notes (Signed)
Initial Nutrition Assessment  DOCUMENTATION CODES:   Severe malnutrition in context of acute illness/injury, Obesity unspecified  INTERVENTION:  - Will order Boost Breeze BID, each supplement provides 250 kcal and 9 grams of protein (can be used to provide medications). - Will order daily multivitamin with minerals.  - Continue to encourage PO intakes of meals and supplements. - Will continue to monitor POC/GOC and associated nutrition-related needs/interventions.  NUTRITION DIAGNOSIS:   Malnutrition (severe) related to acute illness, chronic illness, catabolic illness, cancer and cancer related treatments as evidenced by energy intake < or equal to 50% for > or equal to 5 days, percent weight loss.  GOAL:   Patient will meet greater than or equal to 90% of their needs  MONITOR:   PO intake, Supplement acceptance, Weight trends, Labs  REASON FOR ASSESSMENT:   Malnutrition Screening Tool  ASSESSMENT:   41 y.o. 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  Her chemotherapy regimen was changed prior to her last admission (7/28-8/2) and she has been quite ill from it.  She was treated on 8/29 with Abraxane and Herceptin with dose adjustment and came in to oncologist on 8/30 for IVF.  For the last 1-2 days, she has had worsening n/v.  Emesis x 3 today.  No fevers.  Her husband reports that she is able to tolerate PO but she says she did not eat anything today.  She has hoarseness and dysphagia and reports that the last time she was hospitalized they gave her something in her IV (Diflucan) that cleared it right up.  Pt seen for MST. BMI indicates obesity. No intakes documented since admission. CLD ordered at time of admission and advanced to Regular diet today at 12:05 PM. Pt was seen by an RD  at [facility] on 7/30 during previous admission (7/28-8/2). That note was reviewed. Pt and two daughters at bedside at this time. She has had a few sips of ginger ale today and no other intakes since admission. She states pain with swallowing and lack of desire to consume anything PO d/t this and N/V which has persisted since change in chemo regimen (which occurred prior to admission at the end of July). Pt reports very poor intakes over the past 1 month. No other information obtained at this time as pt with very hoarse voice and reported feeling "groggy" at this time.   Physical assessment deferred until follow-up with respect to pt's comfort. Physical assessment on 7/30 recorded as showing mild/moderate muscle wasting to temple, mild edema, no other muscle wasting and no fat wasting. Per review, pt weighed 224 lbs on 8/2 indicating 16 lb weight loss (7% body weight) in the past 1 month which is significant for time frame. Will monitor weight trends closely in case some of this weight was related to fluid losses following previous admission.  Medications reviewed. Labs reviewed; Ca: 8.6 mg/dL, Alk Phos elevated, LFTs elevated.   IVF: NS @ 75 mL/hr.    Diet Order:  Diet regular Room service appropriate? Yes; Fluid consistency: Thin  Skin:  Reviewed, no issues  Last BM:  9/3  Height:   Ht Readings from Last 1 Encounters:  12/09/16 _0  (1.778 m)    Weight:   Wt Readings from Last 1 Encounters:  12/09/16 208 lb 15.9 oz (94.8 kg)    Ideal Body Weight:  68.18 kg  BMI:  Body mass index is 29.99 kg/m.  Estimated Nutritional Needs:   Kcal:  4383-8184 (25-27 kcal/kg)  Protein:  122-133 grams (1.3-1.4 grams/kg)  Fluid:  >/= 2.2 L/day  EDUCATION NEEDS:   No education needs identified at this time    Jarome Matin, MS, RD, LDN, CNSC Inpatient Clinical Dietitian Pager # [phone] After hours/weekend pager # [phone]

## 2016-12-09 NOTE — H&P (Addendum)
History and Physical    Rita Frazier:[id number] DOB: 07-02-1975 DOA: 12/08/2016  PCP: Patient, No Pcp Per Consultants:  [place] [facility] Patient coming from:  Home - lives with husband and 2 daughters; NOK: Husband, [phone]  Chief Complaint: N/V  HPI: Rita Frazier is a 41 y.o. 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  Her chemotherapy regimen was changed prior to her last admission (7/28-8/2) and she has been quite ill from it.  She was treated on 8/29 with Abraxane and Herceptin with dose adjustment and came in to oncologist on 8/30 for IVF.  For the last 1-2 days, she has had worsening n/v.  Emesis x 3 today.  No fevers.  Her husband reports that she is able to tolerate PO but she says she did not eat anything today.  She has hoarseness and dysphagia and reports that the last time she was hospitalized they gave her something in her IV (Diflucan) that cleared it right up.   ED Course: Chemo-related n/v with neutropenia and worsening LFT elevation.  Given 2L but still not able to tolerate orals and with tachycardia.    Review of Systems: As per HPI; otherwise review of systems reviewed and negative.   Ambulatory Status:  Ambulates without assistance  Past Medical History:  Diagnosis Date  . Anemia   . Cancer ([place])    breast  . Diabetes mellitus, type 2 ([place])     Past Surgical History:  Procedure Laterality Date  . ANKLE SURGERY    . MASTECTOMY Bilateral 01/2014  . PORTACATH PLACEMENT      Social History   Social History  . Marital status: Married    Spouse name: Jazalynn Mireles  . Number of children: 2  . 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 Smoking status: Never Smoker    . Smokeless tobacco: Never Used  . Alcohol use No  . Drug use: No  . Sexual activity: Yes    Birth control/ protection: None   Other Topics Concern  . Not on file   Social History Narrative  . No narrative on file    Allergies  Allergen Reactions  . Hydromorphone Nausea And Vomiting    Family History  Problem Relation Age of Onset  . Cancer Mother   . Cancer Sister     Prior to Admission medications   Medication Sig Start Date End Date Taking? Authorizing Provider  ELIQUIS 5 MG TABS tablet Take 5 mg by mouth 2 (two) times daily. 10/21/16  Yes [provider]  guaiFENesin-dextromethorphan (ROBITUSSIN DM) 100-10 MG/5ML syrup Take 5 mLs by mouth every 4 (four) hours as needed for cough. 11/07/16  Yes Aline August, MD  ondansetron (ZOFRAN-ODT) 8 MG disintegrating tablet Take 1 tablet (8 mg total) by mouth every 8 (eight) hours as needed. Patient taking differently: Take 8 mg by mouth every 8 (eight) hours as needed for nausea or vomiting.  11/07/16  Yes Aline August, MD  prochlorperazine (COMPAZINE) 10 MG tablet Take 10 mg by mouth every 8 (eight) hours as needed for nausea or vomiting.  11/21/16  Yes [provider]  zolpidem (AMBIEN) 5 MG tablet Take 5 mg by mouth at bedtime as needed for sleep.  11/19/16  Yes [provider]  amoxicillin (AMOXIL) 500 MG capsule Take 1 capsule (500 mg total) by mouth every 8 (eight) hours. Patient not taking: Reported on 12/08/2016 11/07/16   Aline August, MD  fluconazole (DIFLUCAN) 200 MG tablet Take 1 tablet (200 mg total) by mouth daily. Patient not taking: Reported on 12/08/2016 11/07/16   Aline August, MD  furosemide (LASIX) 40 MG tablet Take 1 tablet (40 mg total) by mouth daily. Patient not taking: Reported on 12/08/2016 11/07/16 11/07/17  Aline August, MD    Physical Exam: Vitals:   12/09/16 0000 12/09/16 0021 12/09/16 0030 12/09/16 0100  BP: 118/68   115/72  Pulse: (!) 115 (!) 116 (!) 129 (!) 117  Resp: (!) 28 (!)  32 (!) 21 (!) 30  Temp:      TempSrc:      SpO2: (!) 89% 91% 95% 93%  Weight:      Height:         General: Appears calm and but quite chronically ill and uncomfortable  Eyes:  PERRL, EOMI, normal lids, iris; +conjunctival icterus ENT:  grossly normal hearing, lips & tongue, mmm; no thrush clearly visualized but she does have erythema on her tongue Neck:  no LAD, masses or thyromegaly; no carotid bruits Cardiovascular:  Tachycardia, no m/r/g. No LE edema.  Respiratory:   CTA bilaterally with no wheezes/rales/rhonchi.  Normal respiratory effort. Abdomen:  soft, NT, ND, NABS Back:   normal alignment, no CVAT Skin:  no rash or induration seen on limited exam Musculoskeletal:  grossly normal tone BUE/BLE, good ROM, no bony abnormality Psychiatric:  depressed mood and affect, speech fluent and appropriate, AOx3 Neurologic:  CN 2-12 grossly intact, moves all extremities in coordinated fashion, sensation intact    Radiological Exams on Admission: Dg Chest 2 View  Result Date: 12/08/2016 CLINICAL DATA:  Stage IV breast cancer with last chemotherapy treatment 12/04/2016. Vomiting. EXAM: CHEST  2 VIEW COMPARISON:  CXR from 11/02/2016, chest CT 11/02/2016 FINDINGS: Low lung volumes with slight increase in moderate left effusion and stable small right effusion. Mild pulmonary vascular engorgement is seen with fluid in the left major fissure. Scattered pulmonary nodules are noted of the aerated lungs consistent metastatic disease. 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 Heart and mediastinal contours are stable. IMPRESSION: 1. Slightly larger left moderate pleural effusion with fluid extending into the major fissure since prior comparison studies. Stable small right pleural effusion. Mild pulmonary vascular congestion. 2. Pulmonary and osseous metastatic disease again noted, chronic in appearance. Electronically Signed   By: Ashley Royalty M.D.   On: 12/08/2016 22:43    EKG: not  done   Labs on Admission: I have personally reviewed the available labs and imaging studies at the time of the admission.  Pertinent labs:   Glucose 129 AP 362; 351 on 8/2 Albumin 2.1; 2.4 on 8/2 AST 252/ALT 64/Bilirubin 7.6; 74/25/2.0 on 8/2 Lactate 2.33, 2.04 WBC 0.9 Hgb 9.7 - stable Platelets 53; 205 on 8/2 INR 2.71   Assessment/Plan Principal Problem:   Dehydration Active Problems:   Sepsis (H[facility])   Primary breast cancer with metastasis to other site [facility])   Acute esophagitis   Diabetes ([place])   Severe protein-calorie malnutrition ([place])   Deep vein thrombosis (DVT) of left upper extremity ([place])   Dehydration -Patient with known h/o stage IV breast cancer undergoing increasingly aggressive chemotherapy due to lack of response presenting with n/v following recent (8/29) treatment -Most of her presentation could be explained by volume  depletion alone in the setting of chronic illness -Will rehydrate and follow -Patient may start with clears and advance diet as tolerated  Possible sepsis -Depressed WBC count, tachycardia, tachypnea with elevated lactate -While this may simply be attributed to chemotherapy and dehydration, it also may be a preseptic condition. -Negative CXR but she is tachypneic; consider repeating CXR if concerns about PNA -UA not done; will add -Blood and urine cultures pending -Will place in observation status with telemetry and continue to monitor -Treat with IV Vanc and Cefepime for undifferentiated sepsis in a neutropenic patient -Will trend lactate to ensure improvement and add procalcitonin  Stage IV breast cancer -Despite treatments, the liver has diffuse mets on scanning this week -By her albumin alone, the patient has a qualifying diagnosis for hospice -Palliative care and/or hospice have not been previously discussed with the family -The patient seemed more accepting of this than her husband -They are both in agreement with palliative care  consult  Esophagitis -Despite no visible candidiasis, with her hoarseness and complaint of throat pain, it seems likely that this is occurring -Will start Diflucan 200 mg IV x 1 and then 100 mg IV q 24h  DM -It appears that Metformin has been stopped -Given her poor prognosis, it seems unlikely that she will need short- or long-term treatment for this at this time -Will not check glucose or cover with SSI at this time -If fasting glucose is inordinately high, could reconsider  Malnutrition -Due to malignancy -She was seen by nutrition during her prior hospitalization  Left UE DVT.  -Continue Eliquis.   DVT prophylaxis: Eliquis Code Status:  DNR - confirmed with patient/family Family Communication: Husband present throughout evaluation  Disposition Plan:  Home once clinically improved Consults called: Palliative care  Admission status: It is my clinical opinion that referral for OBSERVATION is reasonable and necessary in this patient based on the above information provided. The aforementioned taken together are felt to place the patient at high risk for further clinical deterioration. However it is anticipated that the patient may be medically stable for discharge from the hospital within 24 to 48 hours.    Karmen Bongo MD [facility]  If note is complete, please contact covering daytime or nighttime physician. [email or web address] Password [email or web address]  12/09/2016, 1:34 AM

## 2016-12-10 ENCOUNTER — Inpatient Hospital Stay (HOSPITAL_COMMUNITY): Payer: 59

## 2016-12-10 DIAGNOSIS — C787 Secondary malignant neoplasm of liver and intrahepatic bile duct: Secondary | ICD-10-CM

## 2016-12-10 DIAGNOSIS — T451X5A Adverse effect of antineoplastic and immunosuppressive drugs, initial encounter: Secondary | ICD-10-CM

## 2016-12-10 DIAGNOSIS — D701 Agranulocytosis secondary to cancer chemotherapy: Secondary | ICD-10-CM

## 2016-12-10 DIAGNOSIS — R1310 Dysphagia, unspecified: Secondary | ICD-10-CM

## 2016-12-10 DIAGNOSIS — R112 Nausea with vomiting, unspecified: Secondary | ICD-10-CM

## 2016-12-10 DIAGNOSIS — D696 Thrombocytopenia, unspecified: Secondary | ICD-10-CM

## 2016-12-10 DIAGNOSIS — Z515 Encounter for palliative care: Secondary | ICD-10-CM

## 2016-12-10 DIAGNOSIS — C50919 Malignant neoplasm of unspecified site of unspecified female breast: Secondary | ICD-10-CM

## 2016-12-10 DIAGNOSIS — E860 Dehydration: Secondary | ICD-10-CM

## 2016-12-10 DIAGNOSIS — D709 Neutropenia, unspecified: Secondary | ICD-10-CM

## 2016-12-10 DIAGNOSIS — C7951 Secondary malignant neoplasm of bone: Secondary | ICD-10-CM

## 2016-12-10 DIAGNOSIS — D649 Anemia, unspecified: Secondary | ICD-10-CM

## 2016-12-10 DIAGNOSIS — R5081 Fever presenting with conditions classified elsewhere: Secondary | ICD-10-CM

## 2016-12-10 DIAGNOSIS — Z7189 Other specified counseling: Secondary | ICD-10-CM

## 2016-12-10 LAB — CBC WITH DIFFERENTIAL/PLATELET
BASOS PCT: 2 %
Basophils Absolute: 0 10*3/uL (ref 0.0–0.1)
EOS PCT: 0 %
Eosinophils Absolute: 0 10*3/uL (ref 0.0–0.7)
HEMATOCRIT: 26.4 % — AB (ref 36.0–46.0)
Hemoglobin: 8.5 g/dL — ABNORMAL LOW (ref 12.0–15.0)
LYMPHS PCT: 81 %
Lymphs Abs: 0.4 10*3/uL — ABNORMAL LOW (ref 0.7–4.0)
MCH: 28.1 pg (ref 26.0–34.0)
MCHC: 32.2 g/dL (ref 30.0–36.0)
MCV: 87.4 fL (ref 78.0–100.0)
Monocytes Absolute: 0.1 10*3/uL (ref 0.1–1.0)
Monocytes Relative: 10 %
NEUTROS ABS: 0 10*3/uL — AB (ref 1.7–7.7)
NEUTROS PCT: 7 %
Platelets: 46 10*3/uL — ABNORMAL LOW (ref 150–400)
RBC: 3.02 MIL/uL — ABNORMAL LOW (ref 3.87–5.11)
RDW: 18.4 % — ABNORMAL HIGH (ref 11.5–15.5)
WBC: 0.5 10*3/uL — CL (ref 4.0–10.5)

## 2016-12-10 LAB — COMPREHENSIVE METABOLIC PANEL
ALK PHOS: 350 U/L — AB (ref 38–126)
ALT: 54 U/L (ref 14–54)
AST: 204 U/L — AB (ref 15–41)
Albumin: 1.9 g/dL — ABNORMAL LOW (ref 3.5–5.0)
Anion gap: 7 (ref 5–15)
BILIRUBIN TOTAL: 6.3 mg/dL — AB (ref 0.3–1.2)
BUN: 8 mg/dL (ref 6–20)
CALCIUM: 7.9 mg/dL — AB (ref 8.9–10.3)
CO2: 22 mmol/L (ref 22–32)
CREATININE: 0.51 mg/dL (ref 0.44–1.00)
Chloride: 108 mmol/L (ref 101–111)
GFR calc Af Amer: >60 mL/min (ref >60)
Glucose, Bld: 158 mg/dL — ABNORMAL HIGH (ref 65–99)
Potassium: 3.5 mmol/L (ref 3.5–5.1)
Sodium: 137 mmol/L (ref 135–145)
TOTAL PROTEIN: 6.6 g/dL (ref 6.5–8.1)

## 2016-12-10 MED ORDER — METOCLOPRAMIDE HCL 5 MG/ML IJ SOLN: 10.0000 mg | Freq: Four times a day (QID) | INTRAMUSCULAR | Status: DC | PRN

## 2016-12-10 MED ORDER — IOPAMIDOL (ISOVUE-300) INJECTION 61%
INTRAVENOUS | Status: AC
  Filled 2016-12-10: qty 30

## 2016-12-10 MED ORDER — IOPAMIDOL (ISOVUE-300) INJECTION 61%
15.0000 mL | Freq: Two times a day (BID) | INTRAVENOUS | Status: DC | PRN
  Administered 2016-12-10: 15 mL via ORAL
  Filled 2016-12-10: qty 30

## 2016-12-10 MED ORDER — IOPAMIDOL (ISOVUE-300) INJECTION 61%
100.0000 mL | Freq: Once | INTRAVENOUS | Status: AC | PRN
  Administered 2016-12-10: 100 mL via INTRAVENOUS

## 2016-12-10 MED ORDER — IOPAMIDOL (ISOVUE-300) INJECTION 61%
INTRAVENOUS | Status: AC
  Filled 2016-12-10: qty 100

## 2016-12-10 MED ORDER — TBO-FILGRASTIM 480 MCG/0.8ML ~~LOC~~ SOSY
480.0000 ug | PREFILLED_SYRINGE | Freq: Every day | SUBCUTANEOUS | Status: DC
  Administered 2016-12-11 – 2016-12-12 (×3): 480 ug via SUBCUTANEOUS
  Filled 2016-12-10 (×4): qty 0.8

## 2016-12-10 NOTE — Progress Notes (Signed)
Pt unable to drink/tolerate dye for CT scan.  MD informed and stated that she was okay with CT being completed without oral dye. Rita Frazier A

## 2016-12-10 NOTE — Progress Notes (Signed)
NUTRITION NOTE  Consult for assessment of nutrition requirements/status and poor PO intakes received. Pt was seen for full assessment by this RD yesterday with associated note at 2:26 PM. Patient is being following by Palliative Care with note written this AM; reviewed Palliative Care note at this time. Will continue to monitor POC/GOC and provide recommendations/interventions based on this.   RD will continue to follow per protocol.    Jarome Matin, MS, RD, LDN, [facility] Inpatient Clinical Dietitian Pager # [phone] After hours/weekend pager # [phone]

## 2016-12-10 NOTE — Consult Note (Addendum)
[place]  [phone]   HEMATOLOGY ONCOLOGY INPATIENT CONSULTATION   KALYNN DECLERCQ  DOB: 08-30-1975  MR#: [id number]  CSN#: [id number]    Requesting Physician: [facility]  Patient Care Team: Patient, No Pcp Per as PCP - General (General Practice)  Reason for consult: neutropenia fever, metastatic breast cancer   History of present illness:   Ms Worthey is a 41 year old African-American female, with history of metastatic breast cancer, currently on chemotherapy, presented with worsening fatigue, anorexia, nausea and vomiting. She was admitted for symptom management, and she was found to have profound neutropenia.  She has been under Dr. Algis Greenhouse care for her HR+/HER+ metastatic breast cancer, which was found IN 09/2016. Her detailed oncologic history is difficult to find out fromDr. Chinnasami's office note. The patient's daughters, she was initially diagnosed in 2016, status post bilateral mastectomy and reconstruction, received adjuvant chemotherapy and tamoxifen. She was found to have metastasis to liver, bone, and plura in June 2018, received radiation to left axilla first, and started systemic chemotherapy in July 2018. She received Taxol, Herceptin and pejeta for first cycle, developed severe nausea, vomiting, anorexia, and required hospitalization. She recovered slowly, still quite fatigued, and started second cycle chemotherapy with Abraxane and Herceptin (2 weeks on, one-week off) last week. She again developed significant anorexia, frequent or nausea and vomiting, not able to eat much at home, and presented to emergency room yesterday. She was found to have profound neutropenia with ANC 0, worsening liver function.    MEDICAL HISTORY:  Past Medical History:  Diagnosis Date  . Anemia   . Cancer ([place])    breast  . Diabetes mellitus, type 2 ([place])     SURGICAL HISTORY: Past Surgical History:  Procedure Laterality Date  . ANKLE SURGERY    .  MASTECTOMY Bilateral 01/2014  . PORTACATH PLACEMENT      SOCIAL HISTORY: Social History   Social History  . Marital status: Married    Spouse name: Meka Lewan  . Number of children: 2  . 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 Smoking status: Never Smoker  . Smokeless tobacco: Never Used  . Alcohol use No  . Drug use: No  . Sexual activity: Yes    Birth control/ protection: None   Other Topics Concern  . Not on file   Social History Narrative  . No narrative on file    FAMILY HISTORY: Family History  Problem Relation Age of Onset  . Cancer Mother   . Cancer Sister     ALLERGIES:  is allergic to hydromorphone.  MEDICATIONS:  Current Facility-Administered Medications  Medication Dose Route Frequency Provider Last Rate Last Dose  . 0.9 %  sodium chloride infusion   Intravenous Continuous Karmen Bongo, MD 75 mL/hr at 12/10/16 1958    . acetaminophen (TYLENOL) tablet 650 mg  650 mg Oral Q6H PRN Karmen Bongo, MD       Or  . acetaminophen (TYLENOL) suppository 650 mg  650 mg Rectal Q6H PRN Karmen Bongo, MD      . bismuth subsalicylate (PEPTO BISMOL) 262 MG/15ML suspension 30 mL  30 mL Oral Q4H PRN Blount, Scarlette Shorts T, NP      . ceFEPIme (MAXIPIME) 2 g in dextrose 5 % 50 mL IVPB  2 g Intravenous Q8H Dorrene German, [place] at 12/10/16 1907  . chlorhexidine (PERIDEX) 0.12 %  solution 15 mL  15 mL Mouth Rinse BID Karmen Bongo, MD   15 mL at 12/10/16 0957  . feeding supplement (BOOST / RESOURCE BREEZE) liquid 1 Container  1 Container Oral BID BM Choi, [place], DO   1 Container at 12/10/16 1000  . fluconazole (DIFLUCAN) IVPB 100 mg  100 mg Intravenous Q24H Dessa Phi [place], DO   Stopped at 12/09/16 2221  . guaiFENesin (ROBITUSSIN) 100 MG/5ML solution 100 mg  5 mL Oral Q4H PRN Lovey Newcomer T, NP   100 mg at 12/10/16 1447  . iopamidol (ISOVUE-300) 61 %  injection 15 mL  15 mL Oral BID PRN Dessa Phi Chahn-Yang, DO      . iopamidol (ISOVUE-300) 61 % injection           . lidocaine (XYLOCAINE) 2 % viscous mouth solution 15 mL  15 mL Mouth/Throat Q3H PRN Lovey Newcomer T, NP   15 mL at 12/10/16 0941  . LORazepam (ATIVAN) injection 0.5-1 mg  0.5-1 mg Intravenous Q4H PRN Opyd, Ilene Qua, MD   1 mg at 12/10/16 1329  . magic mouthwash  10 mL Oral TID PRN Lovey Newcomer T, NP   10 mL at 12/10/16 1328  . MEDLINE mouth rinse  15 mL Mouth Rinse q12n4p Karmen Bongo, MD   15 mL at 12/10/16 1600  . metoCLOPramide (REGLAN) injection 10 mg  10 mg Intravenous Q6H PRN Opyd, Ilene Qua, MD      . morphine 2 MG/ML injection 2 mg  2 mg Intravenous Q2H PRN Karmen Bongo, MD   2 mg at 12/10/16 1641  . multivitamin liquid 15 mL  15 mL Oral Daily Dessa Phi Chahn-Yang, DO   15 mL at 12/10/16 6237  . ondansetron (ZOFRAN) tablet 4 mg  4 mg Oral Q6H PRN Karmen Bongo, MD       Or  . ondansetron [facility]) injection 4 mg  4 mg Intravenous Q6H PRN Karmen Bongo, MD   4 mg at 12/09/16 2121  . prochlorperazine (COMPAZINE) tablet 10 mg  10 mg Oral Q8H PRN Karmen Bongo, MD      . sodium chloride flush (NS) 0.9 % injection 3 mL  3 mL Intravenous Q12H Karmen Bongo, MD   3 mL at 12/09/16 2033  . vancomycin (VANCOCIN) IVPB 1000 mg/200 mL premix  1,000 mg Intravenous Q8H Dorrene German, RPH 200 mL/hr at 12/10/16 1954 1,000 mg at 12/10/16 1954  . zolpidem (AMBIEN) tablet 5 mg  5 mg Oral QHS PRN Karmen Bongo, MD        REVIEW OF SYSTEMS:   Constitutional: Denies fevers, chills or abnormal night sweats Eyes: Denies blurriness of vision, double vision or watery eyes Ears, nose, mouth, throat, and face: Denies mucositis or sore throat Respiratory: Denies cough, dyspnea or wheezes Cardiovascular: Denies palpitation, chest discomfort or lower extremity swelling Gastrointestinal:  Denies nausea, heartburn or change in bowel habits Skin: Denies abnormal skin  rashes Lymphatics: Denies new lymphadenopathy or easy bruising Neurological:Denies numbness, tingling or new weaknesses Behavioral/Psych: Mood is stable, no new changes  All other systems were reviewed with the patient and are negative.  PHYSICAL EXAMINATION: ECOG PERFORMANCE STATUS: 4 - Bedbound  Vitals:   12/10/16 0435 12/10/16 1326  BP: (!) 132/55 (!) 128/56  Pulse: (!) 130 (!) 129  Resp: 18 18  Temp: 99.1 F (37.3 C) 99.3 F (37.4 C)  SpO2: 94% 93%   Filed Weights   12/08/16 2143 12/09/16 0208  Weight: 202 lb (91.6 kg) 208  lb 15.9 oz (94.8 kg)    GENERAL:alert, no distress and comfortable SKIN: skin color, texture, turgor are normal, no rashes or significant lesions EYES: normal, conjunctiva are pink and non-injected, sclera clear OROPHARYNX:no exudate, no erythema and lips, buccal mucosa, and tongue normal  NECK: supple, thyroid normal size, non-tender, without nodularity LYMPH:  no palpable lymphadenopathy in the cervical, axillary or inguinal LUNGS: clear to auscultation and percussion with normal breathing effort HEART: regular rate & rhythm and no murmurs and no lower extremity edema ABDOMEN:abdomen soft, non-tender and normal bowel sounds Musculoskeletal:no cyanosis of digits and no clubbing  PSYCH: alert & oriented x 3 with fluent speech NEURO: no focal motor/sensory deficits  LABORATORY DATA:  I have reviewed the data as listed Lab Results  Component Value Date   WBC 0.5 (LL) 12/10/2016   HGB 8.5 (L) 12/10/2016   HCT 26.4 (L) 12/10/2016   MCV 87.4 12/10/2016   PLT 46 (L) 12/10/2016    Recent Labs  11/07/16 0500 12/08/16 2234 12/10/16 0451  NA 141 136 137  K 3.3* 3.5 3.5  CL 109 102 108  CO2 24 23 22   GLUCOSE 107* 129* 158*  BUN 8 11 8   CREATININE 0.84 0.57 0.51  CALCIUM 8.3* 8.6* 7.9*  GFRNONAA >60 >60 >60  GFRAA >60 >60 >60  PROT 6.9 7.3 6.6  ALBUMIN 2.4* 2.1* 1.9*  AST 74* 252* 204*  ALT 25 64* 54  ALKPHOS 351* 362* 350*  BILITOT 2.0*  7.6* 6.3*    RADIOGRAPHIC STUDIES: I have personally reviewed the radiological images as listed and agreed with the findings in the report. Dg Chest 2 View  Result Date: 12/08/2016 CLINICAL DATA:  Stage IV breast cancer with last chemotherapy treatment 12/04/2016. Vomiting. EXAM: CHEST  2 VIEW COMPARISON:  CXR from 11/02/2016, chest CT 11/02/2016 FINDINGS: Low lung volumes with slight increase in moderate left effusion and stable small right effusion. Mild pulmonary vascular engorgement is seen with fluid in the left major fissure. Scattered pulmonary nodules are noted of the aerated lungs consistent metastatic disease. 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 Heart and mediastinal contours are stable. IMPRESSION: 1. Slightly larger left moderate pleural effusion with fluid extending into the major fissure since prior comparison studies. Stable small right pleural effusion. Mild pulmonary vascular congestion. 2. Pulmonary and osseous metastatic disease again noted, chronic in appearance. Electronically Signed   By: Ashley Royalty M.D.   On: 12/08/2016 22:43    ASSESSMENT & PLAN: 41 year old African-American female, with metastatic breast cancer, currently on chemotherapy, admitted for anorexia, nausea vomiting after chemotherapy.  1. N/V and dehydration 2. Possible spesis 3. Metastatic breast cancer to liver, pleura and bones  4. Severe neutropenia, no fever 5. Worsening liver function 6. Severe cavity and protein malnutrition 7. Deconditioning  8. Worsening anemia and thrombocytopenia, likely secondary to chemotherapy   Recommendations: -I agree with broad antibiotics, I will add on granix 443mcg daily  -ID work up pending -continue supportive care  -CT scan pending -she may benefit from thoracentesis  -given her poor tolerance of chemo, I suggest her to switch treatment to TD-M1, which is much more tolerable if she recovers from this episode  -if her CT scan shows significant  cancer progression in the liver, no biliary obstruction, and her liver function continue getting worse, I think hospice and palliative care alone is appropriate -consider blood transfusion if Hb<8.0 and plt<20K  -I will continue follow up, will try to reach out to Dr. Wendee Beavers  All questions were answered. The patient knows to call the clinic with any problems, questions or concerns.      Truitt Merle, MD 12/10/2016 8:00 PM

## 2016-12-10 NOTE — Consult Note (Signed)
Consultation Note Date: 12/10/2016   Patient Name: Rita Frazier  DOB: 03/11/1976  MRN: [id number]  Age / Sex: 41 y.o., female  PCP: Patient, No Pcp Per Referring Physician: Dessa Phi Chahn-Yan*  Reason for Consultation: Non pain symptom management, Pain control and Psychosocial/spiritual support  HPI/Patient Profile: 41 y.o. 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 History of deep vein thrombosis left upper extremity diagnosed in July 2018, placed on oral anticoagulation with Eliquis, patient currently undergoing chemotherapy, admitted with nausea and vomiting poor oral intake. Patient admitted to the hospital for dehydration, neutropenia, empirically placed on broad-spectrum antibiotics, IV fluids.  Patient has also been complaining of throat discomfort, placed on Diflucan. Palliative consultation for pain and non-pain symptom management, supportive care for her young patient with advanced serious illness.  Patient is resting in bed. She appears anxious and is tearful. Husband and daughter are present at the bedside. Patient does not talk much. She avoids eye contact. She appears to be in mild distress. Husband states that the patient has been complaining of throat discomfort. She has ongoing diminishing oral intake. See recommendations below. Thank you for the consult.  NEXT OF KIN  husband, daughter.   SUMMARY OF RECOMMENDATIONS    1. Agree with DNR 2. Agree with current medication regimen: Ativan for anxiety, may need to have a few scheduled doses depending on patient's prn use.  3. Agree with current medication regimen: Lidocaine, magic mouthwash, Diflucan.  Continue Morphine IV PRN for pain.  Recommend spiritual care, patient does appear  to have existential suffering as she faces her serious illness. Offered active listening and supportive care to her and her family.   Code Status/Advance Care Planning:  DNR    Symptom Management:    as above   Palliative Prophylaxis:   Bowel Regimen   Psycho-social/Spiritual:   Desire for further Chaplaincy support:yes  Additional Recommendations: Caregiving  Support/Resources  Prognosis:   Unable to determine  Discharge Planning: To Be Determined      Primary Diagnoses: Present on Admission: . Dehydration . Acute esophagitis . Sepsis ([place]) . Primary breast cancer with metastasis to other site [facility]) . Severe protein-calorie malnutrition ([place]) . Deep vein thrombosis (DVT) of left upper extremity ([place])   I have reviewed the medical record, interviewed the patient and family, and examined the patient. The following aspects are pertinent.  Past Medical History:  Diagnosis Date  . Anemia   . Cancer ([place])    breast  . Diabetes mellitus, type 2 ([place])    Social History   Social History  . Marital status: Married    Spouse name: Barbie Croston  . Number of children: 2  . 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 Smoking status: Never Smoker  . Smokeless tobacco:  Never Used  . Alcohol use No  . Drug use: No  . Sexual activity: Yes    Birth control/ protection: None   Other Topics Concern  . None   Social History Narrative  . None   Family History  Problem Relation Age of Onset  . Cancer Mother   . Cancer Sister    Scheduled Meds: . chlorhexidine  15 mL Mouth Rinse BID  . feeding supplement  1 Container Oral BID BM  . mouth rinse  15 mL Mouth Rinse q12n4p  . multivitamin  15 mL Oral Daily  . sodium chloride flush  3 mL Intravenous Q12H   Continuous Infusions: . sodium chloride 75 mL/hr at 12/10/16 0356  . ceFEPime (MAXIPIME) IV 2 g (12/10/16 0948)  . fluconazole  (DIFLUCAN) IV Stopped (12/09/16 2221)  . vancomycin Stopped (12/10/16 0455)   PRN Meds:.acetaminophen **OR** acetaminophen, bismuth subsalicylate, guaiFENesin, lidocaine, LORazepam, magic mouthwash, metoCLOPramide (REGLAN) injection, morphine injection, ondansetron **OR** ondansetron (ZOFRAN) IV, prochlorperazine, zolpidem Medications Prior to Admission:  Prior to Admission medications   Medication Sig Start Date End Date Taking? Authorizing Provider  ELIQUIS 5 MG TABS tablet Take 5 mg by mouth 2 (two) times daily. 10/21/16  Yes [provider]  guaiFENesin-dextromethorphan (ROBITUSSIN DM) 100-10 MG/5ML syrup Take 5 mLs by mouth every 4 (four) hours as needed for cough. 11/07/16  Yes Aline August, MD  ondansetron (ZOFRAN-ODT) 8 MG disintegrating tablet Take 1 tablet (8 mg total) by mouth every 8 (eight) hours as needed. Patient taking differently: Take 8 mg by mouth every 8 (eight) hours as needed for nausea or vomiting.  11/07/16  Yes Aline August, MD  prochlorperazine (COMPAZINE) 10 MG tablet Take 10 mg by mouth every 8 (eight) hours as needed for nausea or vomiting.  11/21/16  Yes [provider]  zolpidem (AMBIEN) 5 MG tablet Take 5 mg by mouth at bedtime as needed for sleep.  11/19/16  Yes [provider]   Allergies  Allergen Reactions  . Hydromorphone Nausea And Vomiting   Review of Systems +anxiety +difficulty swallowing.   Physical Exam Weak tearful Awake alert Regular breathing Pain in throat S1 S2 Abdomen soft Trace edema  Vital Signs: BP (!) 132/55 (BP Location: Right Arm)   Pulse (!) 130   Temp 99.1 F (37.3 C) (Axillary)   Resp 18   Ht 5\' 10"  (1.778 m)   Wt 94.8 kg (208 lb 15.9 oz)   SpO2 94%   BMI 29.99 kg/m  Pain Assessment: 0-10 POSS *See Group Information*: 1-Acceptable,Awake and alert Pain Score: 9    SpO2: SpO2: 94 % O2 Device:SpO2: 94 % O2 Flow Rate: .   IO: Intake/output summary:  Intake/Output Summary (Last 24 hours) at  12/10/16 1021 Last data filed at 12/10/16 0200  Gross per 24 hour  Intake             1625 ml  Output                0 ml  Net             1625 ml    LBM: Last BM Date: 01/05/17 Baseline Weight: Weight: 91.6 kg (202 lb) Most recent weight: Weight: 94.8 kg (208 lb 15.9 oz)     Palliative Assessment/Data:   Flowsheet Rows     Most Recent Value  Intake Tab  Referral Department  Hospitalist  Unit at Time of Referral  Med/Surg Unit  Palliative Care Primary Diagnosis  Cancer  Palliative Care Type  New Palliative care  Reason for referral  Non-pain Symptom  Date first seen by Palliative Care  12/10/16  Clinical Assessment  Palliative Performance Scale Score  30%  Pain Max last 24 hours  6  Pain Min Last 24 hours  4  Dyspnea Max Last 24 Hours  4  Dyspnea Min Last 24 hours  3  Nausea Max Last 24 Hours  4  Nausea Min Last 24 Hours  3  Anxiety Max Last 24 Hours  6  Anxiety Min Last 24 Hours  4  Psychosocial & Spiritual Assessment  Palliative Care Outcomes  Patient/Family meeting held?  Yes  Who was at the meeting?  patient, husband, daughter   Palliative Care Outcomes  Improved non-pain symptom therapy      Time In:  9 Time Out:10   Time Total: 60   Greater than 50%  of this time was spent counseling and coordinating care related to the above assessment and plan.  Signed by: Loistine Chance, MD  [phone]  Please contact Palliative Medicine Team phone at [phone] for questions and concerns.  For individual provider: See Shea Evans

## 2016-12-10 NOTE — Progress Notes (Signed)
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 She states that her chemotherapy regimen was changed and since then she has been quite ill. She has had very poor oral intake, worsening nausea, vomiting. She denies any fevers or chills. She also admits to hoarseness and dysphagia. Patient was admitted to the hospital secondary to dehydration. Overnight on 9/3, she developed a fever, ANC dropped to 0.   Assessment & Plan:   Principal Problem:   Dehydration Active Problems:   Sepsis ([place])   Primary breast cancer with metastasis to other site [facility])   Acute esophagitis   Diabetes ([place])   Severe protein-calorie malnutrition ([place])   Deep vein thrombosis (DVT) of left upper extremity ([facility])   Chemotherapy-induced neutropenia ([facility])   Encounter for palliative care   Dysphagia  Sepsis and neutropenic fever -Continue empiric vanco, cefepime -Blood cultures, urine culture pending -IVF -Check CT chest due to pleural effusion on CXR on admission. May need thoracentesis.  -Check CT abd/pelvis due to continued nausea, RUQ pain, hyperbilirubinemia  -Check GI PCR due to loose stools. 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 Burr Medico with Oncology consulted, patient follows with Dr. Wendee Beavers at [place] office visit note from 8/30 reviewed. Patient had elevated LFT, weakness, poor oral intake at that time as well. [place] RUQ was completed without biliary dilatation, but did show diffuse hepatic mets. Total bili on 8/29 was 5.6  -Palliative care consulted for goals of care  discussion   Esophagitis -Diflucan IV   Malnutrition -Dietitian consult  Left upper extremity DVT -Holding eliquis in setting of worsening thrombocytopenia. Resume when plt improved    DVT prophylaxis: SCD Code Status: DNR Family Communication: Husband at bedside Disposition Plan: Pending improvement, work up    Consultants:   Oncology  Palliative care   Procedures:   None   Antimicrobials:  Anti-infectives    Start     Dose/Rate Route Frequency Ordered Stop   12/10/16 0000  fluconazole (DIFLUCAN) IVPB 100 mg  Status:  Discontinued     100 mg 50 mL/hr over 60 Minutes Intravenous Every 24 hours 12/09/16 0124 12/09/16 1928   12/09/16 2200  fluconazole (DIFLUCAN) IVPB 100 mg     100 mg 50 mL/hr over 60 Minutes Intravenous Every 24 hours 12/09/16 1928     12/09/16 1200  vancomycin (VANCOCIN) IVPB 1000 mg/200 mL premix     1,000 mg 200 mL/hr over 60 Minutes Intravenous Every 8 hours 12/09/16 0238     12/09/16 0200  vancomycin (VANCOCIN) IVPB 1000 mg/200 mL premix     1,000 mg 200 mL/hr over 60 Minutes Intravenous  Once 12/09/16 0157 12/09/16 0332   12/09/16 0200  ceFEPIme (MAXIPIME) 2 g in dextrose 5 % 50 mL IVPB     2 g 100 mL/hr over 30 Minutes Intravenous Every 8 hours 12/09/16 0153     12/09/16 0130  fluconazole (DIFLUCAN) IVPB 200 mg     200 mg 100 mL/hr over 60 Minutes Intravenous  Once 12/09/16 0124 12/09/16 0334        Subjective:  Patient not feeling any better. Had a low-grade fever 100.8 overnight. Continues to be very hoarse, difficult to swallow anything. Also having some phlegm production as well as coughing out lidocaine/nystatin that she drank. Has some right upper quadrant pain, continued nausea. Had some loose stools.  Objective: Vitals:   12/09/16 0542 12/09/16 1304 12/09/16 2014 12/10/16 0435  BP: (!) 128/55 137/72 (!) 131/59 (!) 132/55  Pulse: (!) 117 (!) 132 (!) 135 (!) 130  Resp: 18 18 20 18   Temp: 98.8 F (37.1 C) (!) 100.8 F (38.2  C) 99.7 F (37.6 C) 99.1 F (37.3 C)  TempSrc: Oral Oral Oral Axillary  SpO2: 93% 91% 93% 94%  Weight:      Height:        Intake/Output Summary (Last 24 hours) at 12/10/16 1234 Last data filed at 12/10/16 0200  Gross per 24 hour  Intake             1625 ml  Output                0 ml  Net             1625 ml   Filed Weights   12/08/16 2143 12/09/16 0208  Weight: 91.6 kg (202 lb) 94.8 kg (208 lb 15.9 oz)    Examination:  General exam: Appears calm, ill appearing  Respiratory system: Clear to auscultation, diminished bases. Respiratory effort normal. Cardiovascular system: S1 & S2 heard, tachycardic, regular rhythm. No JVD, murmurs, rubs, gallops or clicks. No pedal edema. Gastrointestinal system: Abdomen is nondistended, soft and TTP RUQ. No organomegaly or masses felt. Normal bowel sounds heard. Central nervous system: Alert and oriented. No focal neurological deficits. Extremities: Symmetric 5 x 5 power. Skin: No rashes, lesions or ulcers Psychiatry: Judgement and insight appear normal. Mood & affect appropriate.   Data Reviewed: I have personally reviewed following labs and imaging studies  CBC:  Recent Labs Lab 12/08/16 2234 12/09/16 0456 12/10/16 0451  WBC 0.9* 0.6* 0.5*  NEUTROABS 0.5*  --  0.0*  HGB 9.7* 8.8* 8.5*  HCT 28.9* 26.4* 26.4*  MCV 87.3 88.0 87.4  PLT 53* 59* 46*   Basic Metabolic Panel:  Recent Labs Lab 12/08/16 2234 12/10/16 0451  NA 136 137  K 3.5 3.5  CL 102 108  CO2 23 22  GLUCOSE 129* 158*  BUN 11 8  CREATININE 0.57 0.51  CALCIUM 8.6* 7.9*   GFR: Estimated Creatinine Clearance: 117.7 mL/min (by C-G formula based on SCr of 0.51 mg/dL). Liver Function Tests:  Recent Labs Lab 12/08/16 2234 12/10/16 0451  AST 252* 204*  ALT 64* 54  ALKPHOS 362* 350*  BILITOT 7.6* 6.3*  PROT 7.3 6.6  ALBUMIN 2.1* 1.9*    Recent Labs Lab 12/09/16 0009  LIPASE 27   No results for input(s): AMMONIA in the last 168 hours. Coagulation  Profile:  Recent Labs Lab 12/08/16 2234  INR 2.71   Cardiac Enzymes: No results for input(s): CKTOTAL, CKMB, CKMBINDEX, TROPONINI in the last 168 hours. BNP (last 3 results) No results for input(s): PROBNP in the last 8760 hours. HbA1C: No results for input(s): HGBA1C in the last 72 hours. CBG: No results for input(s): GLUCAP in the last 168 hours. Lipid Profile: No results for input(s): CHOL, HDL, LDLCALC, TRIG, CHOLHDL, LDLDIRECT in the last 72 hours. Thyroid Function Tests: No results for input(s): TSH, T4TOTAL, FREET4, T3FREE, THYROIDAB in the last 72 hours. Anemia Panel: No results for input(s): VITAMINB12, FOLATE, FERRITIN, TIBC,  IRON, RETICCTPCT in the last 72 hours. Sepsis Labs:  Recent Labs Lab 12/08/16 2246 12/09/16 0102 12/09/16 0456  PROCALCITON  --   --  1.69  LATICACIDVEN 2.33* 2.04* 1.7    No results found for this or any previous visit (from the past 240 hour(s)).     Radiology Studies: Dg Chest 2 View  Result Date: 12/08/2016 CLINICAL DATA:  Stage IV breast cancer with last chemotherapy treatment 12/04/2016. Vomiting. EXAM: CHEST  2 VIEW COMPARISON:  CXR from 11/02/2016, chest CT 11/02/2016 FINDINGS: Low lung volumes with slight increase in moderate left effusion and stable small right effusion. Mild pulmonary vascular engorgement is seen with fluid in the left major fissure. Scattered pulmonary nodules are noted of the aerated lungs consistent metastatic disease. 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 Heart and mediastinal contours are stable. IMPRESSION: 1. Slightly larger left moderate pleural effusion with fluid extending into the major fissure since prior comparison studies. Stable small right pleural effusion. Mild pulmonary vascular congestion. 2. Pulmonary and osseous metastatic disease again noted, chronic in appearance. Electronically Signed   By: Ashley Royalty M.D.   On: 12/08/2016 22:43      Scheduled Meds: . chlorhexidine  15 mL  Mouth Rinse BID  . feeding supplement  1 Container Oral BID BM  . mouth rinse  15 mL Mouth Rinse q12n4p  . multivitamin  15 mL Oral Daily  . sodium chloride flush  3 mL Intravenous Q12H   Continuous Infusions: . sodium chloride 75 mL/hr at 12/10/16 0356  . ceFEPime (MAXIPIME) IV Stopped (12/10/16 1018)  . fluconazole (DIFLUCAN) IV Stopped (12/09/16 2221)  . vancomycin 1,000 mg (12/10/16 1145)     LOS: 1 day    Time spent: 30 minutes   Dessa Phi, DO [facility] [email or web address] Password [email or web address] 12/10/2016, 12:34 PM

## 2016-12-11 DIAGNOSIS — D701 Agranulocytosis secondary to cancer chemotherapy: Secondary | ICD-10-CM

## 2016-12-11 DIAGNOSIS — Z515 Encounter for palliative care: Secondary | ICD-10-CM

## 2016-12-11 DIAGNOSIS — T451X5A Adverse effect of antineoplastic and immunosuppressive drugs, initial encounter: Secondary | ICD-10-CM

## 2016-12-11 DIAGNOSIS — C799 Secondary malignant neoplasm of unspecified site: Secondary | ICD-10-CM

## 2016-12-11 DIAGNOSIS — K209 Esophagitis, unspecified: Secondary | ICD-10-CM

## 2016-12-11 LAB — CBC WITH DIFFERENTIAL/PLATELET
BASOS PCT: 4 %
Basophils Absolute: 0 10*3/uL (ref 0.0–0.1)
EOS PCT: 0 %
Eosinophils Absolute: 0 10*3/uL (ref 0.0–0.7)
HCT: 25.7 % — ABNORMAL LOW (ref 36.0–46.0)
Hemoglobin: 8.5 g/dL — ABNORMAL LOW (ref 12.0–15.0)
LYMPHS PCT: 88 %
Lymphs Abs: 0.5 10*3/uL — ABNORMAL LOW (ref 0.7–4.0)
MCH: 29.3 pg (ref 26.0–34.0)
MCHC: 33.1 g/dL (ref 30.0–36.0)
MCV: 88.6 fL (ref 78.0–100.0)
Monocytes Absolute: 0 10*3/uL — ABNORMAL LOW (ref 0.1–1.0)
Monocytes Relative: 6 %
NEUTROS PCT: 2 %
Neutro Abs: 0 10*3/uL — ABNORMAL LOW (ref 1.7–7.7)
PLATELETS: 48 10*3/uL — AB (ref 150–400)
RBC: 2.9 MIL/uL — AB (ref 3.87–5.11)
RDW: 18.7 % — ABNORMAL HIGH (ref 11.5–15.5)
WBC: 0.5 10*3/uL — AB (ref 4.0–10.5)

## 2016-12-11 LAB — HEPATIC FUNCTION PANEL
ALBUMIN: 1.8 g/dL — AB (ref 3.5–5.0)
ALT: 48 U/L (ref 14–54)
AST: 176 U/L — AB (ref 15–41)
Alkaline Phosphatase: 336 U/L — ABNORMAL HIGH (ref 38–126)
BILIRUBIN DIRECT: 3.5 mg/dL — AB (ref 0.1–0.5)
Indirect Bilirubin: 2.7 mg/dL — ABNORMAL HIGH (ref 0.3–0.9)
Total Bilirubin: 6.2 mg/dL — ABNORMAL HIGH (ref 0.3–1.2)
Total Protein: 6.4 g/dL — ABNORMAL LOW (ref 6.5–8.1)

## 2016-12-11 LAB — BASIC METABOLIC PANEL
ANION GAP: 7 (ref 5–15)
BUN: 6 mg/dL (ref 6–20)
CALCIUM: 8.1 mg/dL — AB (ref 8.9–10.3)
CO2: 21 mmol/L — ABNORMAL LOW (ref 22–32)
Chloride: 109 mmol/L (ref 101–111)
Creatinine, Ser: 0.44 mg/dL (ref 0.44–1.00)
Glucose, Bld: 124 mg/dL — ABNORMAL HIGH (ref 65–99)
Potassium: 3.3 mmol/L — ABNORMAL LOW (ref 3.5–5.1)
SODIUM: 137 mmol/L (ref 135–145)

## 2016-12-11 LAB — URINE CULTURE: CULTURE: NO GROWTH

## 2016-12-11 LAB — VANCOMYCIN, TROUGH: Vancomycin Tr: 17 ug/mL (ref 15–20)

## 2016-12-11 MED ORDER — KETOROLAC TROMETHAMINE 15 MG/ML IJ SOLN
15.0000 mg | Freq: Four times a day (QID) | INTRAMUSCULAR | Status: DC | PRN
  Administered 2016-12-11 – 2016-12-13 (×3): 15 mg via INTRAVENOUS
  Filled 2016-12-11 (×3): qty 1

## 2016-12-11 MED ORDER — POTASSIUM CHLORIDE 10 MEQ/100ML IV SOLN
10.0000 meq | INTRAVENOUS | Status: AC
  Administered 2016-12-11 (×4): 10 meq via INTRAVENOUS
  Filled 2016-12-11 (×4): qty 100

## 2016-12-11 MED ORDER — MORPHINE SULFATE (PF) 2 MG/ML IV SOLN
1.0000 mg | INTRAVENOUS | Status: DC | PRN
  Administered 2016-12-11: 1 mg via INTRAVENOUS
  Administered 2016-12-12 (×2): 2 mg via INTRAVENOUS
  Administered 2016-12-12: 1 mg via INTRAVENOUS
  Administered 2016-12-13 (×2): 2 mg via INTRAVENOUS
  Filled 2016-12-11 (×6): qty 1

## 2016-12-11 MED ORDER — FUROSEMIDE 10 MG/ML IJ SOLN
40.0000 mg | Freq: Two times a day (BID) | INTRAMUSCULAR | Status: DC
  Administered 2016-12-11 – 2016-12-13 (×5): 40 mg via INTRAVENOUS
  Filled 2016-12-11 (×5): qty 4

## 2016-12-11 NOTE — Progress Notes (Signed)
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 She states that her chemotherapy regimen was changed and since then she has been quite ill. She has had very poor oral intake, worsening nausea, vomiting, weakness, dysphagia. Patient was admitted to the hospital secondary to dehydration. Overnight on 9/3, she developed a fever, ANC dropped to 0.   Assessment & Plan:   Febrile Neutropenia - Has been on empiric Vanc and Cefepime since 9/3 - Blood Cx negative - no clear source of infection - s/p Granix 9/4 - will stop Vancomycin, continue cefepime Day 3 -CT chest and Abd pelvis with mod bilateral effusions, Pulm mets, and lung opacity Pna vs worsening mets and Ct abd with worsening liver mets, probable wall thickening of colon but obscure due to ascites  Widely metastatic Breast CA -with mets to lungs, bones and worsening liver mets, bili now 6-7 -with ongoing failure to thrive, severe hypoalbuminemia -very poor Po intake, functional status, third spacing with ascites, pleural effusions -palliative care following -I called [facility] twice today, they tell me that they have just reviewed her case with Dr.Feng today, will discuss with Dr.Feng and Palliative medicine -prognosis appears very poor  Pancytopenia -due to aggressive Chemo -s/p Granix 9/4  Esophagitis -continue Diflucan IV   Severe protein calorie Malnutrition -Dietitian consult  Left upper extremity DVT -Holding eliquis in setting of worsening thrombocytopenia. Resume when plt improved    -SCDs for now  DVT prophylaxis: SCD Code Status: DNR Family Communication: sister at bedside Disposition Plan: Pending improvement, work up    Consultants:   Oncology  Palliative care   Procedures:   None   Antimicrobials:  Anti-infectives    Start     Dose/Rate Route Frequency Ordered Stop   12/10/16 0000  fluconazole (DIFLUCAN) IVPB 100 mg  Status:  Discontinued     100 mg 50 mL/hr over 60 Minutes Intravenous Every 24 hours 12/09/16 0124 12/09/16 1928   12/09/16 2200  fluconazole (DIFLUCAN) IVPB 100 mg     100 mg 50 mL/hr over 60 Minutes Intravenous Every 24 hours 12/09/16 1928     12/09/16 1200  vancomycin (VANCOCIN) IVPB 1000 mg/200 mL premix  Status:  Discontinued     1,000 mg 200 mL/hr over 60 Minutes Intravenous Every 8 hours 12/09/16 0238 12/11/16 1115   12/09/16 0200  vancomycin (VANCOCIN) IVPB 1000 mg/200 mL premix     1,000 mg 200 mL/hr over 60 Minutes Intravenous  Once 12/09/16 0157 12/09/16 0332   12/09/16 0200  ceFEPIme (MAXIPIME) 2 g in dextrose 5 % 50 mL IVPB     2 g 100 mL/hr over 30 Minutes Intravenous Every 8 hours 12/09/16 0153     12/09/16 0130  fluconazole (DIFLUCAN) IVPB 200 mg     200 mg 100 mL/hr over 60 Minutes Intravenous  Once 12/09/16 0124 12/09/16 0334       Subjective: Feels poorly, very poor Po intake, no N/V  Objective: Vitals:   12/10/16 2018 12/10/16 2315 12/11/16 0445 12/11/16 1359  BP: 135/67  140/67 (!) 128/56  Pulse: (!) 136 Marland Kitchen)  128 (!) 134 (!) 122  Resp: 18  20 20   Temp: 98.7 F (37.1 C)  98.4 F (36.9 C) 98.4 F (36.9 C)  TempSrc: Oral  Oral Oral  SpO2: 93%  93% 95%  Weight:      Height:        Intake/Output Summary (Last 24 hours) at 12/11/16 1547 Last data filed at 12/11/16 1534  Gross per 24 hour  Intake          2056.25 ml  Output             1250 ml  Net           806.25 ml   Filed Weights   12/08/16 2143 12/09/16 0208  Weight: 91.6 kg (202 lb) 94.8 kg (208 lb 15.9 oz)    Examination:  Gen:  chronically ill female, laying in bed, no distress, tearful HEENT: PERRLA, no obvious thrush noted Lungs: decreased BS at bases CVS: RRR,No Gallops,Rubs or new Murmurs Abd: soft, distended, fluid thrill noted, non tender Extremities: No Cyanosis, Clubbing or edema Skin: no new rashes Extremities: Symmetric 5 x 5 power. Psychiatry: anxious upset, tearful  Data Reviewed: I have personally reviewed following labs and imaging studies  CBC:  Recent Labs Lab 12/08/16 2234 12/09/16 0456 12/10/16 0451 12/11/16 0528  WBC 0.9* 0.6* 0.5* 0.5*  NEUTROABS 0.5*  --  0.0* 0.0*  HGB 9.7* 8.8* 8.5* 8.5*  HCT 28.9* 26.4* 26.4* 25.7*  MCV 87.3 88.0 87.4 88.6  PLT 53* 59* 46* 48*   Basic Metabolic Panel:  Recent Labs Lab 12/08/16 2234 12/10/16 0451 12/11/16 0528  NA 136 137 137  K 3.5 3.5 3.3*  CL 102 108 109  CO2 23 22 21*  GLUCOSE 129* 158* 124*  BUN 11 8 6   CREATININE 0.57 0.51 0.44  CALCIUM 8.6* 7.9* 8.1*   GFR: Estimated Creatinine Clearance: 117.7 mL/min (by C-G formula based on SCr of 0.44 mg/dL). Liver Function Tests:  Recent Labs Lab 12/08/16 2234 12/10/16 0451 12/11/16 0528  AST 252* 204* 176*  ALT 64* 54 48  ALKPHOS 362* 350* 336*  BILITOT 7.6* 6.3* 6.2*  PROT 7.3 6.6 6.4*  ALBUMIN 2.1* 1.9* 1.8*    Recent Labs Lab 12/09/16 0009  LIPASE 27   No results for input(s): AMMONIA in the last 168 hours. Coagulation Profile:  Recent Labs Lab 12/08/16 2234  INR 2.71   Cardiac Enzymes: No results for input(s): CKTOTAL, CKMB, CKMBINDEX, TROPONINI in the last 168 hours. BNP (last 3 results) No results for input(s): PROBNP in the last 8760 hours. HbA1C: No results for input(s): HGBA1C in the last 72 hours. CBG: No results for input(s): GLUCAP in the last 168 hours. Lipid Profile: No results for input(s): CHOL, HDL, LDLCALC, TRIG, CHOLHDL, LDLDIRECT in the last 72 hours. Thyroid Function Tests: No results for input(s): TSH, T4TOTAL, FREET4, T3FREE,  THYROIDAB in the last 72 hours. Anemia Panel: No results for input(s): VITAMINB12, FOLATE, FERRITIN, TIBC, IRON, RETICCTPCT in the last 72 hours. Sepsis Labs:  Recent Labs Lab 12/08/16 2246 12/09/16 0102 12/09/16 0456  PROCALCITON  --   --  1.69  LATICACIDVEN 2.33* 2.04* 1.7    Recent Results (from the past 240 hour(s))  Culture, blood (Routine x 2)     Status: None (Preliminary result)   Collection Time: 12/08/16 10:34 PM  Result Value Ref Range Status   Specimen Description BLOOD RIGHT HAND  Final   Special Requests   Final    BOTTLES DRAWN  AEROBIC AND ANAEROBIC Blood Culture adequate volume   Culture   Final    NO GROWTH 2 DAYS Performed at [facility], [place] [address], [place], [place] [address]    Report Status PENDING  Incomplete  Culture, blood (Routine x 2)     Status: None (Preliminary result)   Collection Time: 12/09/16 12:40 AM  Result Value Ref Range Status   Specimen Description BLOOD RIGHT ANTECUBITAL  Final   Special Requests   Final    BOTTLES DRAWN AEROBIC AND ANAEROBIC Blood Culture adequate volume   Culture   Final    NO GROWTH 2 DAYS Performed at [facility], [place] [address] [place], [place] [address]    Report Status PENDING  Incomplete  Culture, Urine     Status: None   Collection Time: 12/10/16  1:19 PM  Result Value Ref Range Status   Specimen Description URINE, CLEAN CATCH  Final   Special Requests Immunocompromised  Final   Culture   Final    NO GROWTH Performed at [facility], [place] [address] [place], [place] [address]    Report Status 12/11/2016 FINAL  Final       Radiology Studies: Ct Chest W Contrast  Result Date: 12/10/2016 CLINICAL DATA:  Pleural effusion. Nausea and vomiting. Metastatic breast cancer. EXAM: CT CHEST, ABDOMEN, AND PELVIS WITH CONTRAST TECHNIQUE: Multidetector CT imaging of the chest, abdomen and pelvis was performed following the standard protocol during bolus administration of intravenous  contrast. CONTRAST:  100 cc Isovue-300 IV COMPARISON:  PET-CT 09/24/2016.  Chest CTA 11/02/2016 FINDINGS: CT CHEST FINDINGS Cardiovascular: Thoracic aorta is normal in caliber. The heart is normal in size. No pericardial effusion. Mediastinum/Nodes: Left upper lobe perihilar opacity spans 4.3 x 2.0 cm, abutting the hilum. No definite hilar adenopathy. No definite right hilar adenopathy. No enlarged mediastinal nodes. Prominent left axillary node measures 12 mm short axis, increased Left supraclavicular adenopathy on PET is not well-defined on the current exam. Heterogeneous enlarged right lobe of the thyroid gland again seen. The esophagus is decompressed. Lungs/Pleura: Moderate right pleural effusion, with increased from prior CT. Progressive left pleural effusion, now moderate in size. There is adjacent atelectasis in the dependent lungs. Multiple pulmonary nodules consistent with metastatic disease are again seen, not as well assessed given breathing motion artifact. Apical septal thickening is again seen. New left upper lobe perihilar masslike opacity measures approximately 4.3 x 2.0 cm with internal air bronchograms. Trachea mainstem bronchi are grossly patent. Musculoskeletal: Sclerotic lesion in the right humeral head on prior exam is not as well seen currently. Multifocal sclerotic lesions throughout the thoracic spine. No evidence pathologic fracture. CT ABDOMEN PELVIS FINDINGS Hepatobiliary: Innumerable hepatic lesions consistent with metastatic disease. Question of progression from prior PET allowing for differences in technique. Gallbladder partially distended. No evidence of biliary obstruction. Pancreas: No ductal dilatation or inflammation. Spleen: No focal splenic lesion. Adrenals/Urinary Tract: Bilateral adrenal thickening without dominant mass. No hydronephrosis. Ureters are decompressed. Urinary bladder is physiologically distended. Stomach/Bowel: No bowel obstruction, enteric contrast has reached  the ascending colon. The stomach is nondistended. Possible wall thickening of the cecum and ascending colon, adjacent ascites limits evaluation. Small colonic stool burden. Vascular/Lymphatic: Adenopathy in the porta hepatis disc ala to separate from the adjacent liver parenchyma. Multifocal retroperitoneal nodes, which may have decreased from prior PET normal caliber abdominal aorta. Reproductive: Enlarged uterus with multiple fibroids. Prominent endometrium which was seen on prior PET. Left ovaries normal in size. Right  ovary not well seen. Other: Small to moderate volume of ascites in the abdomen and pelvis. Confluent edema in the subcutaneous flanks. No free air. Musculoskeletal: Sclerotic osseous metastatic disease at the lumbar spine and bony pelvis, with progression from prior exam. For example sclerotic lesion within L5 vertebral body measures 16 mm, previously 8 mm on PET. No evidence of pathologic fracture. IMPRESSION: CT CHEST IMPRESSION: 1. Moderate bilateral pleural effusions, increased in size from prior exams. 2. Multifocal metastatic pulmonary nodules, suboptimally assessed given breathing motion artifact. Sclerotic osseous metastatic disease appears similar to prior. Increased size of left axillary lymph node. 3. New left upper lobe perihilar masslike opacity measuring approximately 4.3 x 2.0 cm may be pneumonia or progression of metastatic disease. CT ABDOMEN/PELVIS IMPRESSION: 1. Widespread hepatic metastatic disease, with likely progression from prior PET. 2. Small to moderate abdominopelvic ascites, new. 3. Probable wall thickening of the cecum and ascending colon, adjacent ascites partially obscures evaluation. This may be infectious or inflammatory. 4. Osseous metastatic disease in the lumbar spine and pelvis with mild progression from prior exam Electronically Signed   By: Jeb Levering M.D.   On: 12/10/2016 22:03   Ct Abdomen Pelvis W Contrast  Result Date: 12/10/2016 CLINICAL DATA:   Pleural effusion. Nausea and vomiting. Metastatic breast cancer. EXAM: CT CHEST, ABDOMEN, AND PELVIS WITH CONTRAST TECHNIQUE: Multidetector CT imaging of the chest, abdomen and pelvis was performed following the standard protocol during bolus administration of intravenous contrast. CONTRAST:  100 cc Isovue-300 IV COMPARISON:  PET-CT 09/24/2016.  Chest CTA 11/02/2016 FINDINGS: CT CHEST FINDINGS Cardiovascular: Thoracic aorta is normal in caliber. The heart is normal in size. No pericardial effusion. Mediastinum/Nodes: Left upper lobe perihilar opacity spans 4.3 x 2.0 cm, abutting the hilum. No definite hilar adenopathy. No definite right hilar adenopathy. No enlarged mediastinal nodes. Prominent left axillary node measures 12 mm short axis, increased Left supraclavicular adenopathy on PET is not well-defined on the current exam. Heterogeneous enlarged right lobe of the thyroid gland again seen. The esophagus is decompressed. Lungs/Pleura: Moderate right pleural effusion, with increased from prior CT. Progressive left pleural effusion, now moderate in size. There is adjacent atelectasis in the dependent lungs. Multiple pulmonary nodules consistent with metastatic disease are again seen, not as well assessed given breathing motion artifact. Apical septal thickening is again seen. New left upper lobe perihilar masslike opacity measures approximately 4.3 x 2.0 cm with internal air bronchograms. Trachea mainstem bronchi are grossly patent. Musculoskeletal: Sclerotic lesion in the right humeral head on prior exam is not as well seen currently. Multifocal sclerotic lesions throughout the thoracic spine. No evidence pathologic fracture. CT ABDOMEN PELVIS FINDINGS Hepatobiliary: Innumerable hepatic lesions consistent with metastatic disease. Question of progression from prior PET allowing for differences in technique. Gallbladder partially distended. No evidence of biliary obstruction. Pancreas: No ductal dilatation or  inflammation. Spleen: No focal splenic lesion. Adrenals/Urinary Tract: Bilateral adrenal thickening without dominant mass. No hydronephrosis. Ureters are decompressed. Urinary bladder is physiologically distended. Stomach/Bowel: No bowel obstruction, enteric contrast has reached the ascending colon. The stomach is nondistended. Possible wall thickening of the cecum and ascending colon, adjacent ascites limits evaluation. Small colonic stool burden. Vascular/Lymphatic: Adenopathy in the porta hepatis disc ala to separate from the adjacent liver parenchyma. Multifocal retroperitoneal nodes, which may have decreased from prior PET normal caliber abdominal aorta. Reproductive: Enlarged uterus with multiple fibroids. Prominent endometrium which was seen on prior PET. Left ovaries normal in size. Right ovary not well seen. Other: Small to moderate volume  of ascites in the abdomen and pelvis. Confluent edema in the subcutaneous flanks. No free air. Musculoskeletal: Sclerotic osseous metastatic disease at the lumbar spine and bony pelvis, with progression from prior exam. For example sclerotic lesion within L5 vertebral body measures 16 mm, previously 8 mm on PET. No evidence of pathologic fracture. IMPRESSION: CT CHEST IMPRESSION: 1. Moderate bilateral pleural effusions, increased in size from prior exams. 2. Multifocal metastatic pulmonary nodules, suboptimally assessed given breathing motion artifact. Sclerotic osseous metastatic disease appears similar to prior. Increased size of left axillary lymph node. 3. New left upper lobe perihilar masslike opacity measuring approximately 4.3 x 2.0 cm may be pneumonia or progression of metastatic disease. CT ABDOMEN/PELVIS IMPRESSION: 1. Widespread hepatic metastatic disease, with likely progression from prior PET. 2. Small to moderate abdominopelvic ascites, new. 3. Probable wall thickening of the cecum and ascending colon, adjacent ascites partially obscures evaluation. This may  be infectious or inflammatory. 4. Osseous metastatic disease in the lumbar spine and pelvis with mild progression from prior exam Electronically Signed   By: Jeb Levering M.D.   On: 12/10/2016 22:03      Scheduled Meds: . chlorhexidine  15 mL Mouth Rinse BID  . feeding supplement  1 Container Oral BID BM  . furosemide  40 mg Intravenous Q12H  . mouth rinse  15 mL Mouth Rinse q12n4p  . multivitamin  15 mL Oral Daily  . sodium chloride flush  3 mL Intravenous Q12H  . Tbo-filgastrim (GRANIX) SQ  480 mcg Subcutaneous q1800   Continuous Infusions: . ceFEPime (MAXIPIME) IV Stopped (12/11/16 1124)  . fluconazole (DIFLUCAN) IV Stopped (12/10/16 2327)     LOS: 2 days    Time spent: 24 minutes   Domenic Polite, MD [facility] Page via [email or web address] Password [email or web address] 12/11/2016, 3:47 PM

## 2016-12-11 NOTE — Progress Notes (Signed)
Rita Frazier   DOB:1976-02-07   CW#:[id number]   VVO#:[id number]  Oncology follow-up note  Subjective: Patient is more alert today, feels slightly better, afebrile, still has very poor appetite, not eating much.    Objective:  Vitals:   12/11/16 1359 12/11/16 2203  BP: (!) 128/56 126/77  Pulse: (!) 122 (!) 133  Resp: 20 20  Temp: 98.4 F (36.9 C) 98.4 F (36.9 C)  SpO2: 95% 96%    Body mass index is 29.99 kg/m.  Intake/Output Summary (Last 24 hours) at 12/11/16 2310 Last data filed at 12/11/16 2200  Gross per 24 hour  Intake          1281.25 ml  Output              350 ml  Net           931.25 ml     Mild jaundice   Oropharynx clear  No peripheral adenopathy  Lungs clear -- no rales or rhonchi  Heart regular rate and rhythm  Abdomen benign  MSK no focal spinal tenderness, no peripheral edema  Neuro nonfocal    CBG (last 3)  No results for input(s): GLUCAP in the last 72 hours.   Labs:  Urine Studies No results for input(s): UHGB, CRYS in the last 72 hours.  Invalid input(s): UACOL, UAPR, USPG, UPH, UTP, UGL, UKET, UBIL, UNIT, UROB, ULEU, UEPI, UWBC, URBC, UBAC, CAST, [place], [place]  Basic Metabolic Panel:  Recent Labs Lab 12/08/16 2234 12/10/16 0451 12/11/16 0528  NA 136 137 137  K 3.5 3.5 3.3*  CL 102 108 109  CO2 23 22 21*  GLUCOSE 129* 158* 124*  BUN _0 CREATININE 0.57 0.51 0.44  CALCIUM 8.6* 7.9* 8.1*   GFR Estimated Creatinine Clearance: 117.7 mL/min (by C-G formula based on SCr of 0.44 mg/dL). Liver Function Tests:  Recent Labs Lab 12/08/16 2234 12/10/16 0451 12/11/16 0528  AST 252* 204* 176*  ALT 64* 54 48  ALKPHOS 362* 350* 336*  BILITOT 7.6* 6.3* 6.2*  PROT 7.3 6.6 6.4*  ALBUMIN 2.1* 1.9* 1.8*    Recent Labs Lab 12/09/16 0009  LIPASE 27   No results for input(s): AMMONIA in the last 168 hours. Coagulation profile  Recent Labs Lab 12/08/16 2234  INR 2.71    CBC:  Recent Labs Lab 12/08/16 2234 12/09/16 0456  12/10/16 0451 12/11/16 0528  WBC 0.9* 0.6* 0.5* 0.5*  NEUTROABS 0.5*  --  0.0* 0.0*  HGB 9.7* 8.8* 8.5* 8.5*  HCT 28.9* 26.4* 26.4* 25.7*  MCV 87.3 88.0 87.4 88.6  PLT 53* 59* 46* 48*   Cardiac Enzymes: No results for input(s): CKTOTAL, CKMB, CKMBINDEX, TROPONINI in the last 168 hours. BNP: Invalid input(s): POCBNP CBG: No results for input(s): GLUCAP in the last 168 hours. D-Dimer No results for input(s): DDIMER in the last 72 hours. Hgb A1c No results for input(s): HGBA1C in the last 72 hours. Lipid Profile No results for input(s): CHOL, HDL, LDLCALC, TRIG, CHOLHDL, LDLDIRECT in the last 72 hours. Thyroid function studies No results for input(s): TSH, T4TOTAL, T3FREE, THYROIDAB in the last 72 hours.  Invalid input(s): FREET3 Anemia work up No results for input(s): VITAMINB12, FOLATE, FERRITIN, TIBC, IRON, RETICCTPCT in the last 72 hours. Microbiology Recent Results (from the past 240 hour(s))  Culture, blood (Routine x 2)     Status: None (Preliminary result)   Collection Time: 12/08/16 10:34 PM  Result Value Ref Range Status   Specimen Description BLOOD  RIGHT HAND  Final   Special Requests   Final    BOTTLES DRAWN AEROBIC AND ANAEROBIC Blood Culture adequate volume   Culture   Final    NO GROWTH 2 DAYS Performed at [facility], [address], [place], [place] [address]    Report Status PENDING  Incomplete  Culture, blood (Routine x 2)     Status: None (Preliminary result)   Collection Time: 12/09/16 12:40 AM  Result Value Ref Range Status   Specimen Description BLOOD RIGHT ANTECUBITAL  Final   Special Requests   Final    BOTTLES DRAWN AEROBIC AND ANAEROBIC Blood Culture adequate volume   Culture   Final    NO GROWTH 2 DAYS Performed at [facility], [place] [address], [place], [place] [address]    Report Status PENDING  Incomplete  Culture, Urine     Status: None   Collection Time: 12/10/16  1:19 PM  Result Value Ref Range Status   Specimen  Description URINE, CLEAN CATCH  Final   Special Requests Immunocompromised  Final   Culture   Final    NO GROWTH Performed at [facility], [place] [address] [place],  [address]    Report Status 12/11/2016 FINAL  Final      Studies:  Ct Chest W Contrast  Result Date: 12/10/2016 CLINICAL DATA:  Pleural effusion. Nausea and vomiting. Metastatic breast cancer. EXAM: CT CHEST, ABDOMEN, AND PELVIS WITH CONTRAST TECHNIQUE: Multidetector CT imaging of the chest, abdomen and pelvis was performed following the standard protocol during bolus administration of intravenous contrast. CONTRAST:  100 cc Isovue-300 IV COMPARISON:  PET-CT 09/24/2016.  Chest CTA 11/02/2016 FINDINGS: CT CHEST FINDINGS Cardiovascular: Thoracic aorta is normal in caliber. The heart is normal in size. No pericardial effusion. Mediastinum/Nodes: Left upper lobe perihilar opacity spans 4.3 x 2.0 cm, abutting the hilum. No definite hilar adenopathy. No definite right hilar adenopathy. No enlarged mediastinal nodes. Prominent left axillary node measures 12 mm short axis, increased Left supraclavicular adenopathy on PET is not well-defined on the current exam. Heterogeneous enlarged right lobe of the thyroid gland again seen. The esophagus is decompressed. Lungs/Pleura: Moderate right pleural effusion, with increased from prior CT. Progressive left pleural effusion, now moderate in size. There is adjacent atelectasis in the dependent lungs. Multiple pulmonary nodules consistent with metastatic disease are again seen, not as well assessed given breathing motion artifact. Apical septal thickening is again seen. New left upper lobe perihilar masslike opacity measures approximately 4.3 x 2.0 cm with internal air bronchograms. Trachea mainstem bronchi are grossly patent. Musculoskeletal: Sclerotic lesion in the right humeral head on prior exam is not as well seen currently. Multifocal sclerotic lesions throughout the thoracic spine. No  evidence pathologic fracture. CT ABDOMEN PELVIS FINDINGS Hepatobiliary: Innumerable hepatic lesions consistent with metastatic disease. Question of progression from prior PET allowing for differences in technique. Gallbladder partially distended. No evidence of biliary obstruction. Pancreas: No ductal dilatation or inflammation. Spleen: No focal splenic lesion. Adrenals/Urinary Tract: Bilateral adrenal thickening without dominant mass. No hydronephrosis. Ureters are decompressed. Urinary bladder is physiologically distended. Stomach/Bowel: No bowel obstruction, enteric contrast has reached the ascending colon. The stomach is nondistended. Possible wall thickening of the cecum and ascending colon, adjacent ascites limits evaluation. Small colonic stool burden. Vascular/Lymphatic: Adenopathy in the porta hepatis disc ala to separate from the adjacent liver parenchyma. Multifocal retroperitoneal nodes, which may have decreased from prior PET normal caliber abdominal aorta. Reproductive: Enlarged uterus with multiple  fibroids. Prominent endometrium which was seen on prior PET. Left ovaries normal in size. Right ovary not well seen. Other: Small to moderate volume of ascites in the abdomen and pelvis. Confluent edema in the subcutaneous flanks. No free air. Musculoskeletal: Sclerotic osseous metastatic disease at the lumbar spine and bony pelvis, with progression from prior exam. For example sclerotic lesion within L5 vertebral body measures 16 mm, previously 8 mm on PET. No evidence of pathologic fracture. IMPRESSION: CT CHEST IMPRESSION: 1. Moderate bilateral pleural effusions, increased in size from prior exams. 2. Multifocal metastatic pulmonary nodules, suboptimally assessed given breathing motion artifact. Sclerotic osseous metastatic disease appears similar to prior. Increased size of left axillary lymph node. 3. New left upper lobe perihilar masslike opacity measuring approximately 4.3 x 2.0 cm may be pneumonia  or progression of metastatic disease. CT ABDOMEN/PELVIS IMPRESSION: 1. Widespread hepatic metastatic disease, with likely progression from prior PET. 2. Small to moderate abdominopelvic ascites, new. 3. Probable wall thickening of the cecum and ascending colon, adjacent ascites partially obscures evaluation. This may be infectious or inflammatory. 4. Osseous metastatic disease in the lumbar spine and pelvis with mild progression from prior exam Electronically Signed   By: Jeb Levering M.D.   On: 12/10/2016 22:03   Ct Abdomen Pelvis W Contrast  Result Date: 12/10/2016 CLINICAL DATA:  Pleural effusion. Nausea and vomiting. Metastatic breast cancer. EXAM: CT CHEST, ABDOMEN, AND PELVIS WITH CONTRAST TECHNIQUE: Multidetector CT imaging of the chest, abdomen and pelvis was performed following the standard protocol during bolus administration of intravenous contrast. CONTRAST:  100 cc Isovue-300 IV COMPARISON:  PET-CT 09/24/2016.  Chest CTA 11/02/2016 FINDINGS: CT CHEST FINDINGS Cardiovascular: Thoracic aorta is normal in caliber. The heart is normal in size. No pericardial effusion. Mediastinum/Nodes: Left upper lobe perihilar opacity spans 4.3 x 2.0 cm, abutting the hilum. No definite hilar adenopathy. No definite right hilar adenopathy. No enlarged mediastinal nodes. Prominent left axillary node measures 12 mm short axis, increased Left supraclavicular adenopathy on PET is not well-defined on the current exam. Heterogeneous enlarged right lobe of the thyroid gland again seen. The esophagus is decompressed. Lungs/Pleura: Moderate right pleural effusion, with increased from prior CT. Progressive left pleural effusion, now moderate in size. There is adjacent atelectasis in the dependent lungs. Multiple pulmonary nodules consistent with metastatic disease are again seen, not as well assessed given breathing motion artifact. Apical septal thickening is again seen. New left upper lobe perihilar masslike opacity  measures approximately 4.3 x 2.0 cm with internal air bronchograms. Trachea mainstem bronchi are grossly patent. Musculoskeletal: Sclerotic lesion in the right humeral head on prior exam is not as well seen currently. Multifocal sclerotic lesions throughout the thoracic spine. No evidence pathologic fracture. CT ABDOMEN PELVIS FINDINGS Hepatobiliary: Innumerable hepatic lesions consistent with metastatic disease. Question of progression from prior PET allowing for differences in technique. Gallbladder partially distended. No evidence of biliary obstruction. Pancreas: No ductal dilatation or inflammation. Spleen: No focal splenic lesion. Adrenals/Urinary Tract: Bilateral adrenal thickening without dominant mass. No hydronephrosis. Ureters are decompressed. Urinary bladder is physiologically distended. Stomach/Bowel: No bowel obstruction, enteric contrast has reached the ascending colon. The stomach is nondistended. Possible wall thickening of the cecum and ascending colon, adjacent ascites limits evaluation. Small colonic stool burden. Vascular/Lymphatic: Adenopathy in the porta hepatis disc ala to separate from the adjacent liver parenchyma. Multifocal retroperitoneal nodes, which may have decreased from prior PET normal caliber abdominal aorta. Reproductive: Enlarged uterus with multiple fibroids. Prominent endometrium which was seen on prior PET.  Left ovaries normal in size. Right ovary not well seen. Other: Small to moderate volume of ascites in the abdomen and pelvis. Confluent edema in the subcutaneous flanks. No free air. Musculoskeletal: Sclerotic osseous metastatic disease at the lumbar spine and bony pelvis, with progression from prior exam. For example sclerotic lesion within L5 vertebral body measures 16 mm, previously 8 mm on PET. No evidence of pathologic fracture. IMPRESSION: CT CHEST IMPRESSION: 1. Moderate bilateral pleural effusions, increased in size from prior exams. 2. Multifocal metastatic  pulmonary nodules, suboptimally assessed given breathing motion artifact. Sclerotic osseous metastatic disease appears similar to prior. Increased size of left axillary lymph node. 3. New left upper lobe perihilar masslike opacity measuring approximately 4.3 x 2.0 cm may be pneumonia or progression of metastatic disease. CT ABDOMEN/PELVIS IMPRESSION: 1. Widespread hepatic metastatic disease, with likely progression from prior PET. 2. Small to moderate abdominopelvic ascites, new. 3. Probable wall thickening of the cecum and ascending colon, adjacent ascites partially obscures evaluation. This may be infectious or inflammatory. 4. Osseous metastatic disease in the lumbar spine and pelvis with mild progression from prior exam Electronically Signed   By: Jeb Levering M.D.   On: 12/10/2016 22:03    Assessment: 41 y.o.  African-American female, with metastatic breast cancer, currently on chemotherapy, admitted for anorexia, nausea vomiting after chemotherapy.  1. N/V and dehydration 2. Neutropenic fever and spesis 3. Metastatic breast cancer to liver, pleura and bones, ER-/PR-/HER2+ 4. Severe neutropenia 5. Worsening liver function 6. Severe calori and protein malnutrition 7. Deconditioning  8. Pancytopenia secondary to chemotherapy  Plan:  -I reviewed her CT scan findings with pt and her husband, which unfortunately showed disease progression, compared with prior PET scan in June 2018. -Continue granix daily until [facility] >1.5K -She has significant abnormal liver function, especially hyperbilirubinemia, likely secondary to her liver metastasis and chemotherapy, no biliary obstruction on scan. -we discuss if she her overall condition and LFTs improve, she maybe eligible for second line chemo such as TD-M1 which is much more tolerable  -However if her overall condition and LFT continued to deteriorate, we should consider hospice and palliative care alone.  -I spoke with her primary oncologist Dr.  Wendee Beavers who agrees with the above  -appreciate palliative care input -please consider megace for her anorexia  -thoracentesis if she is more symptomatic  -will continue follow  -she is DNR   Truitt Merle, MD 12/11/2016

## 2016-12-11 NOTE — Progress Notes (Signed)
PMT progress note.   Patient is sitting up in chair, family at bedside, CT scan results from last night noted.  BP 140/67 (BP Location: Right Arm)   Pulse (!) 134   Temp 98.4 F (36.9 C) (Oral)   Resp 20   Ht 5\' 10"  (1.778 m)   Wt 94.8 kg (208 lb 15.9 oz)   SpO2 93%   BMI 29.99 kg/m  Labs and imaging noted.  Awake alert Sitting in chair Mild distress Regular  no LE edema  Metastatic breast cancer, was recently on chemotherapy, now admitted with ongoign fatigue, nausea vomiting, dysphagia, profound neutropenia, started on Granix,   Discussed with RN, change Morphine to 1-2 mg IV PRN, will also add Toradol IV PRN for pain.  Ongoing decline, diminishing po intake. Will continue to follow and help facilitate additional goals of care discussions, depending on medical oncology input and recommendations.   Loistine Chance MD [phone]  [facility] palliative medicine team.

## 2016-12-11 NOTE — Care Management Note (Signed)
Case Management Note  Patient Details  Name: Rita Frazier MRN: [id number] Date of Birth: 05-23-1975  Subjective/Objective: 41 y/o f admitted w/dehydration. From home. DNR,Met Breast Ca,mets liver,bone. Palliative team following-symptom/pain mgmnt. Per nsg no PT cons needed currently. D/c plan home.                   Action/Plan:d/c plan home.   Expected Discharge Date:                  Expected Discharge Plan:  Home/Self Care  In-House Referral:     Discharge planning Services  CM Consult  Post Acute Care Choice:    Choice offered to:     DME Arranged:    DME Agency:     HH Arranged:    HH Agency:     Status of Service:  In process, will continue to follow  If discussed at Long Length of Stay Meetings, dates discussed:    Additional Comments:  Dessa Phi, RN 12/11/2016, 10:45 AM

## 2016-12-11 NOTE — Progress Notes (Signed)
Pharmacy Antibiotic Note  Rita Frazier is a 41 y.o. 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  Pharmacy has been consulted for cefepime and vancomycin dosing.  Today, 12/11/2016 Day #3 Vanc/Cefepime Tmax/24h 99.3 WBC 0.5, ANC 0 - Granix started 9/4 SCr 0.44, stable Cultures negative to date  Plan: Continue Cefepime 2 Gm IV q8h Check vancomycin trough today at 11:00, goal =15-20 mg/L Consider stopping vancomycin if there is no source/concern for MRSA Follow up renal function & cultures  Height: 5\' 10"  (177.8 cm) Weight: 208 lb 15.9 oz (94.8 kg) IBW/kg (Calculated) : 68.5  Temp (24hrs), Avg:98.8 F (37.1 C), Min:98.4 F (36.9 C), Max:99.3 F (37.4 C)   Recent Labs Lab 12/08/16 2234 12/08/16 2246 12/09/16 0102 12/09/16 0456 12/10/16 0451 12/11/16 0528  WBC 0.9*  --   --  0.6* 0.5* 0.5*  CREATININE 0.57  --   --   --  0.51 0.44  LATICACIDVEN  --  2.33* 2.04* 1.7  --   --     Estimated Creatinine Clearance: 117.7 mL/min (by C-G formula based on SCr of 0.44 mg/dL).    Allergies  Allergen Reactions  . Hydromorphone Nausea And Vomiting    Antimicrobials this admission: 9/3 cefepime >>  9/3 fluconazole >>  9/3 vancomycin >>  Dose adjustments this admission: 9/5 VT at 11:00 = ___ on 1g q8h  Microbiology results: 9/3 BCx: ngtd 9/4 UCx: sent  Thank you for allowing pharmacy to be a part of this patient's care.  Peggyann Juba, PharmD, BCPS Pager: [phone] 12/11/2016 10:55 AM

## 2016-12-12 ENCOUNTER — Encounter (HOSPITAL_COMMUNITY): Payer: Self-pay

## 2016-12-12 DIAGNOSIS — I82722 Chronic embolism and thrombosis of deep veins of left upper extremity: Secondary | ICD-10-CM

## 2016-12-12 LAB — HEPATIC FUNCTION PANEL
ALT: 47 U/L (ref 14–54)
AST: 179 U/L — AB (ref 15–41)
Albumin: 1.8 g/dL — ABNORMAL LOW (ref 3.5–5.0)
Alkaline Phosphatase: 387 U/L — ABNORMAL HIGH (ref 38–126)
BILIRUBIN DIRECT: 3.8 mg/dL — AB (ref 0.1–0.5)
BILIRUBIN TOTAL: 6.7 mg/dL — AB (ref 0.3–1.2)
Indirect Bilirubin: 2.9 mg/dL — ABNORMAL HIGH (ref 0.3–0.9)
Total Protein: 6.6 g/dL (ref 6.5–8.1)

## 2016-12-12 LAB — CBC WITH DIFFERENTIAL/PLATELET
BASOS PCT: 0 %
Basophils Absolute: 0 10*3/uL (ref 0.0–0.1)
EOS PCT: 0 %
Eosinophils Absolute: 0 10*3/uL (ref 0.0–0.7)
HEMATOCRIT: 27.1 % — AB (ref 36.0–46.0)
Hemoglobin: 9 g/dL — ABNORMAL LOW (ref 12.0–15.0)
LYMPHS ABS: 0.4 10*3/uL — AB (ref 0.7–4.0)
Lymphocytes Relative: 78 %
MCH: 29 pg (ref 26.0–34.0)
MCHC: 33.2 g/dL (ref 30.0–36.0)
MCV: 87.4 fL (ref 78.0–100.0)
MONO ABS: 0.1 10*3/uL (ref 0.1–1.0)
Monocytes Relative: 12 %
NEUTROS ABS: 0.1 10*3/uL — AB (ref 1.7–7.7)
Neutrophils Relative %: 10 %
Platelets: 60 10*3/uL — ABNORMAL LOW (ref 150–400)
RBC: 3.1 MIL/uL — ABNORMAL LOW (ref 3.87–5.11)
RDW: 18.9 % — AB (ref 11.5–15.5)
WBC: 0.6 10*3/uL — CL (ref 4.0–10.5)

## 2016-12-12 LAB — BASIC METABOLIC PANEL
ANION GAP: 9 (ref 5–15)
BUN: 10 mg/dL (ref 6–20)
CALCIUM: 8.4 mg/dL — AB (ref 8.9–10.3)
CO2: 21 mmol/L — AB (ref 22–32)
Chloride: 111 mmol/L (ref 101–111)
Creatinine, Ser: 0.63 mg/dL (ref 0.44–1.00)
GFR calc Af Amer: >60 mL/min (ref >60)
GFR calc non Af Amer: >60 mL/min (ref >60)
GLUCOSE: 90 mg/dL (ref 65–99)
Potassium: 3.5 mmol/L (ref 3.5–5.1)
Sodium: 141 mmol/L (ref 135–145)

## 2016-12-12 MED ORDER — BOOST / RESOURCE BREEZE PO LIQD
1.0000 | ORAL | Status: DC
  Administered 2016-12-13: 1 via ORAL

## 2016-12-12 MED ORDER — GI COCKTAIL ~~LOC~~
30.0000 mL | Freq: Three times a day (TID) | ORAL | Status: DC | PRN
  Administered 2016-12-12 (×2): 30 mL via ORAL
  Filled 2016-12-12 (×2): qty 30

## 2016-12-12 MED ORDER — DRONABINOL 2.5 MG PO CAPS
5.0000 mg | ORAL_CAPSULE | Freq: Two times a day (BID) | ORAL | Status: DC
  Administered 2016-12-13: 5 mg via ORAL
  Filled 2016-12-12: qty 2

## 2016-12-12 MED ORDER — PANTOPRAZOLE SODIUM 40 MG IV SOLR
40.0000 mg | Freq: Every day | INTRAVENOUS | Status: DC
  Administered 2016-12-12 – 2016-12-13 (×2): 40 mg via INTRAVENOUS
  Filled 2016-12-12 (×2): qty 40

## 2016-12-12 NOTE — Progress Notes (Signed)
Nutrition Follow-up  DOCUMENTATION CODES:   Severe malnutrition in context of acute illness/injury, Obesity unspecified  INTERVENTION:  - Will decrease Boost Breeze to once/day. - Continue to encourage PO intakes of meals and supplement, as tolerated. - RD will continue to monitor POC/GOC and associated nutrition needs.  NUTRITION DIAGNOSIS:   Malnutrition (severe) related to acute illness, chronic illness, catabolic illness, cancer and cancer related treatments as evidenced by energy intake < or equal to 50% for > or equal to 5 days, percent weight loss. -ongoing  GOAL:   Patient will meet greater than or equal to 90% of their needs -unmet   MONITOR:   PO intake, Supplement acceptance, Weight trends, Labs  ASSESSMENT:   41 y.o. 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  Her chemotherapy regimen was changed prior to her last admission (7/28-8/2) and she has been quite ill from it.  She was treated on 8/29 with Abraxane and Herceptin with dose adjustment and came in to oncologist on 8/30 for IVF.  For the last 1-2 days, she has had worsening n/v.  Emesis x 3 today.  No fevers.  Her husband reports that she is able to tolerate PO but she says she did not eat anything today.  She has hoarseness and dysphagia and reports that the last time she was hospitalized they gave her something in her IV (Diflucan) that cleared it right up.  9/6 Pt has mainly been consuming sips, if anything, since initial assessment. She has mainly been refusing [employer]. No new weight since 9/3. Palliative Care is following. Notes all indicate ongoing fatigue, N/V, and minimal PO intakes. Per Dr. Delene Loll note yesterday afternoon: "CT chest and Abd pelvis with mod bilateral effusions, Pulm mets, and lung opacity Pna  vs worsening mets and Ct abd with worsening liver mets, probable wall thickening of colon but obscure due to ascites." Her note also states pt with third spacing with ascites and that "prognosis appears very poor."   Medications reviewed; 5 mg Marinol BID, 100 mg IV Diflucan/day, 40 mg IV Lasix BID, 15 mL liquid multivitamin/day, 10 mEq IV KCl x4 runs yesterday. Labs reviewed; Ca: 8.4 mg/dL, Alk Phos elevated and trending up, AST elevated.     9/3 - No intakes documented since admission.  - CLD ordered at time of admission and advanced to Regular diet today at 12:05 PM.  - Pt was [facility] by an RD at [facility] on 7/30 during previous admission (7/28-8/2). - She has had a few sips of ginger ale today and no other intakes since admission.  - She states pain with swallowing and lack of desire to consume anything PO d/t this and persistent N/V since change in chemo regimen (which occurred prior to admission at the end of July).  - Pt reports very poor intakes over the past 1 month.  - No other information obtained at this time as pt with very hoarse voice and reported feeling "groggy".  - Physical assessment deferred until follow-up with respect to pt's comfort.  - Physical assessment on 7/30 recorded as showing mild/moderate muscle wasting to temple, mild edema, no other muscle wasting and no fat wasting.  - Per review, pt weighed 224 lbs on 8/2 indicating 16 lb weight loss (7% body weight) in the past 1 month which is significant for time frame.   IVF:  NS @ 75 mL/hr.     Diet Order:  Diet regular Room service appropriate? Yes; Fluid consistency: Thin  Skin:  Reviewed, no issues  Last BM:  9/5  Height:   Ht Readings from Last 1 Encounters:  12/09/16 5' 10" (1.778 m)    Weight:   Wt Readings from Last 1 Encounters:  12/09/16 208 lb 15.9 oz (94.8 kg)    Ideal Body Weight:  68.18 kg  BMI:  Body mass index is 29.99 kg/m.  Estimated Nutritional Needs:   Kcal:  6712-4580 (25-27  kcal/kg)  Protein:  122-133 grams (1.3-1.4 grams/kg)  Fluid:  >/= 2.2 L/day  EDUCATION NEEDS:   No education needs identified at this time    Jarome Matin, MS, RD, LDN, CNSC Inpatient Clinical Dietitian Pager # [phone] After hours/weekend pager # [phone]

## 2016-12-12 NOTE — Progress Notes (Signed)
Patient continues to have trouble swallowing due to increased pain/burning in throat. As a result patient has been unable to take PO medications, tablets, capsules and or liquid. Provider contacted with update. GI Cocktail prescribed and administered. Pt was not able to swallow but able to coat mouth and throat. Pt reports some relief 3/10. Will continue to monitor.

## 2016-12-12 NOTE — Progress Notes (Signed)
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 She states that her chemotherapy regimen was changed and since then she has been quite ill. She has had very poor oral intake, worsening nausea, vomiting, weakness, dysphagia. Patient was admitted to the hospital secondary to dehydration. Overnight on 9/3, she developed a fever, ANC dropped to 0.   Assessment & Plan:   Febrile Neutropenia - was on empiric Vanc and Cefepime since 9/3 - Blood Cx negative - no clear source of infection - Stopped vancomycin 9/5, continue cefepime day 4 -CT chest and Abd pelvis with mod bilateral effusions, Pulm mets, and lung opacity Pna vs worsening mets and Ct abd with worsening liver mets, probable wall thickening of colon but obscure due to ascites -Remains afebrile -Continue subcutaneous Granix daily due to severe neutropenia  Widely metastatic Breast CA -with mets to lungs, bones and worsening liver mets, bili now 6-7 -with ongoing failure to thrive, severe hypoalbuminemia -very poor Po intake, functional status, third spacing with ascites, pleural effusions -palliative care following -Dr.Feng reviewed case with oncologist at [facility] yesterday 9/5, plan to continue best supportive care and if there is clinical improvement to consider alternate chemotherapy however otherwise recommendation for hospice  -I had a long discussion with patient, spouse, siblings today at bedside -started her on Marinol today as an appetite stimulant, we discussed the fact that her prognosis appears to be very  poor with multisystem involvement of her disease and limited functional status as a result -will -follow-up again with patient and family in a.m. and continue ongoing discussions with   Pancytopenia -due to aggressive Chemo -s/p Granix since 9/4  Esophagitis -continue Diflucan IV   Severe protein calorie Malnutrition -Dietitian consult  Left upper extremity DVT -Holding eliquis in setting of worsening thrombocytopenia. Resume when plt improved  -SCDs for now  DVT prophylaxis: SCD Code Status: DNR Family Communication: family at bedside Disposition Plan: Pending improvement, work up    Consultants:   Oncology  Palliative care   Procedures:   None   Antimicrobials:  Anti-infectives    Start     Dose/Rate Route Frequency Ordered Stop   12/10/16 0000  fluconazole (DIFLUCAN) IVPB 100 mg  Status:  Discontinued     100 mg 50 mL/hr over 60 Minutes Intravenous Every 24 hours 12/09/16 0124 12/09/16 1928   12/09/16 2200  fluconazole (DIFLUCAN) IVPB 100 mg     100 mg 50 mL/hr over 60 Minutes Intravenous Every 24 hours 12/09/16 1928     12/09/16 1200  vancomycin (VANCOCIN) IVPB 1000 mg/200 mL premix  Status:  Discontinued     1,000 mg 200 mL/hr over 60 Minutes Intravenous Every 8 hours 12/09/16 0238 12/11/16 1115   12/09/16 0200  vancomycin (VANCOCIN) IVPB 1000 mg/200 mL premix     1,000 mg 200 mL/hr over 60 Minutes Intravenous  Once 12/09/16 0157 12/09/16 0332   12/09/16 0200  ceFEPIme (MAXIPIME) 2 g in dextrose 5 % 50 mL IVPB     2 g 100 mL/hr over 30 Minutes Intravenous Every 8 hours 12/09/16 0153  12/09/16 0130  fluconazole (DIFLUCAN) IVPB 200 mg     200 mg 100 mL/hr over 60 Minutes Intravenous  Once 12/09/16 0124 12/09/16 0334       Subjective: -Appetite remains very poor was able to drink some smooth the yesterday and have a few sips of soup, no dyspnea at rest but becomes short of breath with any activity  Objective: Vitals:   12/11/16 0445 12/11/16 1359  12/11/16 2203 12/12/16 0644  BP: 140/67 (!) 128/56 126/77 132/64  Pulse: (!) 134 (!) 122 (!) 133 (!) 127  Resp: 20 20 20 20   Temp: 98.4 F (36.9 C) 98.4 F (36.9 C) 98.4 F (36.9 C) 98.3 F (36.8 C)  TempSrc: Oral Oral Oral Oral  SpO2: 93% 95% 96% 96%  Weight:      Height:        Intake/Output Summary (Last 24 hours) at 12/12/16 1309 Last data filed at 12/12/16 0600  Gross per 24 hour  Intake              400 ml  Output                0 ml  Net              400 ml   Filed Weights   12/08/16 2143 12/09/16 0208  Weight: 91.6 kg (202 lb) 94.8 kg (208 lb 15.9 oz)    Examination:  Gen: Chronically ill female, appears much older than stated age, lying in bed, uncomfortable, tearful HEENT: Pupils equal and reactive, neck no JVD Lungs decreased breath sounds at both bases CVS S1-S2 regular rate rhythm Abdomen is soft mildly distended, fluid thrill present, nontender, bowel sounds present, extremities: 1+ edema bilaterally Neuro, moves all extremities no localizing signs Skin: no new rashes Psychiatry: anxious upset, tearful  Data Reviewed: I have personally reviewed following labs and imaging studies  CBC:  Recent Labs Lab 12/08/16 2234 12/09/16 0456 12/10/16 0451 12/11/16 0528 12/12/16 0453  WBC 0.9* 0.6* 0.5* 0.5* 0.6*  NEUTROABS 0.5*  --  0.0* 0.0* 0.1*  HGB 9.7* 8.8* 8.5* 8.5* 9.0*  HCT 28.9* 26.4* 26.4* 25.7* 27.1*  MCV 87.3 88.0 87.4 88.6 87.4  PLT 53* 59* 46* 48* 60*   Basic Metabolic Panel:  Recent Labs Lab 12/08/16 2234 12/10/16 0451 12/11/16 0528 12/12/16 0453  NA 136 137 137 141  K 3.5 3.5 3.3* 3.5  CL 102 108 109 111  CO2 23 22 21* 21*  GLUCOSE 129* 158* 124* 90  BUN 11 8 6 10   CREATININE 0.57 0.51 0.44 0.63  CALCIUM 8.6* 7.9* 8.1* 8.4*   GFR: Estimated Creatinine Clearance: 117.7 mL/min (by C-G formula based on SCr of 0.63 mg/dL). Liver Function Tests:  Recent Labs Lab 12/08/16 2234 12/10/16 0451 12/11/16 0528 12/12/16 0453  AST  252* 204* 176* 179*  ALT 64* 54 48 47  ALKPHOS 362* 350* 336* 387*  BILITOT 7.6* 6.3* 6.2* 6.7*  PROT 7.3 6.6 6.4* 6.6  ALBUMIN 2.1* 1.9* 1.8* 1.8*    Recent Labs Lab 12/09/16 0009  LIPASE 27   No results for input(s): AMMONIA in the last 168 hours. Coagulation Profile:  Recent Labs Lab 12/08/16 2234  INR 2.71   Cardiac Enzymes: No results for input(s): CKTOTAL, CKMB, CKMBINDEX, TROPONINI in the last 168 hours. BNP (last 3 results) No results for input(s): PROBNP in the last 8760 hours. HbA1C: No results for input(s): HGBA1C in the last 72 hours. CBG: No results for input(s): GLUCAP in  the last 168 hours. Lipid Profile: No results for input(s): CHOL, HDL, LDLCALC, TRIG, CHOLHDL, LDLDIRECT in the last 72 hours. Thyroid Function Tests: No results for input(s): TSH, T4TOTAL, FREET4, T3FREE, THYROIDAB in the last 72 hours. Anemia Panel: No results for input(s): VITAMINB12, FOLATE, FERRITIN, TIBC, IRON, RETICCTPCT in the last 72 hours. Sepsis Labs:  Recent Labs Lab 12/08/16 2246 12/09/16 0102 12/09/16 0456  PROCALCITON  --   --  1.69  LATICACIDVEN 2.33* 2.04* 1.7    Recent Results (from the past 240 hour(s))  Culture, blood (Routine x 2)     Status: None (Preliminary result)   Collection Time: 12/08/16 10:34 PM  Result Value Ref Range Status   Specimen Description BLOOD RIGHT HAND  Final   Special Requests   Final    BOTTLES DRAWN AEROBIC AND ANAEROBIC Blood Culture adequate volume   Culture   Final    NO GROWTH 3 DAYS Performed at [facility], [place] [address] [place], [place] [address]    Report Status PENDING  Incomplete  Culture, blood (Routine x 2)     Status: None (Preliminary result)   Collection Time: 12/09/16 12:40 AM  Result Value Ref Range Status   Specimen Description BLOOD RIGHT ANTECUBITAL  Final   Special Requests   Final    BOTTLES DRAWN AEROBIC AND ANAEROBIC Blood Culture adequate volume   Culture   Final    NO GROWTH 3  DAYS Performed at [facility], [place] [address], [place], [place] [address]    Report Status PENDING  Incomplete  Culture, Urine     Status: None   Collection Time: 12/10/16  1:19 PM  Result Value Ref Range Status   Specimen Description URINE, CLEAN CATCH  Final   Special Requests Immunocompromised  Final   Culture   Final    NO GROWTH Performed at [facility], [place] [address], [place], [place] [address]    Report Status 12/11/2016 FINAL  Final       Radiology Studies: Ct Chest W Contrast  Result Date: 12/10/2016 CLINICAL DATA:  Pleural effusion. Nausea and vomiting. Metastatic breast cancer. EXAM: CT CHEST, ABDOMEN, AND PELVIS WITH CONTRAST TECHNIQUE: Multidetector CT imaging of the chest, abdomen and pelvis was performed following the standard protocol during bolus administration of intravenous contrast. CONTRAST:  100 cc Isovue-300 IV COMPARISON:  PET-CT 09/24/2016.  Chest CTA 11/02/2016 FINDINGS: CT CHEST FINDINGS Cardiovascular: Thoracic aorta is normal in caliber. The heart is normal in size. No pericardial effusion. Mediastinum/Nodes: Left upper lobe perihilar opacity spans 4.3 x 2.0 cm, abutting the hilum. No definite hilar adenopathy. No definite right hilar adenopathy. No enlarged mediastinal nodes. Prominent left axillary node measures 12 mm short axis, increased Left supraclavicular adenopathy on PET is not well-defined on the current exam. Heterogeneous enlarged right lobe of the thyroid gland again seen. The esophagus is decompressed. Lungs/Pleura: Moderate right pleural effusion, with increased from prior CT. Progressive left pleural effusion, now moderate in size. There is adjacent atelectasis in the dependent lungs. Multiple pulmonary nodules consistent with metastatic disease are again seen, not as well assessed given breathing motion artifact. Apical septal thickening is again seen. New left upper lobe perihilar masslike opacity measures approximately 4.3 x 2.0 cm  with internal air bronchograms. Trachea mainstem bronchi are grossly patent. Musculoskeletal: Sclerotic lesion in the right humeral head on prior exam is not as well seen currently. Multifocal sclerotic lesions throughout the thoracic spine. No evidence pathologic fracture. CT ABDOMEN  PELVIS FINDINGS Hepatobiliary: Innumerable hepatic lesions consistent with metastatic disease. Question of progression from prior PET allowing for differences in technique. Gallbladder partially distended. No evidence of biliary obstruction. Pancreas: No ductal dilatation or inflammation. Spleen: No focal splenic lesion. Adrenals/Urinary Tract: Bilateral adrenal thickening without dominant mass. No hydronephrosis. Ureters are decompressed. Urinary bladder is physiologically distended. Stomach/Bowel: No bowel obstruction, enteric contrast has reached the ascending colon. The stomach is nondistended. Possible wall thickening of the cecum and ascending colon, adjacent ascites limits evaluation. Small colonic stool burden. Vascular/Lymphatic: Adenopathy in the porta hepatis disc ala to separate from the adjacent liver parenchyma. Multifocal retroperitoneal nodes, which may have decreased from prior PET normal caliber abdominal aorta. Reproductive: Enlarged uterus with multiple fibroids. Prominent endometrium which was seen on prior PET. Left ovaries normal in size. Right ovary not well seen. Other: Small to moderate volume of ascites in the abdomen and pelvis. Confluent edema in the subcutaneous flanks. No free air. Musculoskeletal: Sclerotic osseous metastatic disease at the lumbar spine and bony pelvis, with progression from prior exam. For example sclerotic lesion within L5 vertebral body measures 16 mm, previously 8 mm on PET. No evidence of pathologic fracture. IMPRESSION: CT CHEST IMPRESSION: 1. Moderate bilateral pleural effusions, increased in size from prior exams. 2. Multifocal metastatic pulmonary nodules, suboptimally assessed  given breathing motion artifact. Sclerotic osseous metastatic disease appears similar to prior. Increased size of left axillary lymph node. 3. New left upper lobe perihilar masslike opacity measuring approximately 4.3 x 2.0 cm may be pneumonia or progression of metastatic disease. CT ABDOMEN/PELVIS IMPRESSION: 1. Widespread hepatic metastatic disease, with likely progression from prior PET. 2. Small to moderate abdominopelvic ascites, new. 3. Probable wall thickening of the cecum and ascending colon, adjacent ascites partially obscures evaluation. This may be infectious or inflammatory. 4. Osseous metastatic disease in the lumbar spine and pelvis with mild progression from prior exam Electronically Signed   By: Jeb Levering M.D.   On: 12/10/2016 22:03   Ct Abdomen Pelvis W Contrast  Result Date: 12/10/2016 CLINICAL DATA:  Pleural effusion. Nausea and vomiting. Metastatic breast cancer. EXAM: CT CHEST, ABDOMEN, AND PELVIS WITH CONTRAST TECHNIQUE: Multidetector CT imaging of the chest, abdomen and pelvis was performed following the standard protocol during bolus administration of intravenous contrast. CONTRAST:  100 cc Isovue-300 IV COMPARISON:  PET-CT 09/24/2016.  Chest CTA 11/02/2016 FINDINGS: CT CHEST FINDINGS Cardiovascular: Thoracic aorta is normal in caliber. The heart is normal in size. No pericardial effusion. Mediastinum/Nodes: Left upper lobe perihilar opacity spans 4.3 x 2.0 cm, abutting the hilum. No definite hilar adenopathy. No definite right hilar adenopathy. No enlarged mediastinal nodes. Prominent left axillary node measures 12 mm short axis, increased Left supraclavicular adenopathy on PET is not well-defined on the current exam. Heterogeneous enlarged right lobe of the thyroid gland again seen. The esophagus is decompressed. Lungs/Pleura: Moderate right pleural effusion, with increased from prior CT. Progressive left pleural effusion, now moderate in size. There is adjacent atelectasis in the  dependent lungs. Multiple pulmonary nodules consistent with metastatic disease are again seen, not as well assessed given breathing motion artifact. Apical septal thickening is again seen. New left upper lobe perihilar masslike opacity measures approximately 4.3 x 2.0 cm with internal air bronchograms. Trachea mainstem bronchi are grossly patent. Musculoskeletal: Sclerotic lesion in the right humeral head on prior exam is not as well seen currently. Multifocal sclerotic lesions throughout the thoracic spine. No evidence pathologic fracture. CT ABDOMEN PELVIS FINDINGS Hepatobiliary: Innumerable hepatic lesions consistent with metastatic  disease. Question of progression from prior PET allowing for differences in technique. Gallbladder partially distended. No evidence of biliary obstruction. Pancreas: No ductal dilatation or inflammation. Spleen: No focal splenic lesion. Adrenals/Urinary Tract: Bilateral adrenal thickening without dominant mass. No hydronephrosis. Ureters are decompressed. Urinary bladder is physiologically distended. Stomach/Bowel: No bowel obstruction, enteric contrast has reached the ascending colon. The stomach is nondistended. Possible wall thickening of the cecum and ascending colon, adjacent ascites limits evaluation. Small colonic stool burden. Vascular/Lymphatic: Adenopathy in the porta hepatis disc ala to separate from the adjacent liver parenchyma. Multifocal retroperitoneal nodes, which may have decreased from prior PET normal caliber abdominal aorta. Reproductive: Enlarged uterus with multiple fibroids. Prominent endometrium which was seen on prior PET. Left ovaries normal in size. Right ovary not well seen. Other: Small to moderate volume of ascites in the abdomen and pelvis. Confluent edema in the subcutaneous flanks. No free air. Musculoskeletal: Sclerotic osseous metastatic disease at the lumbar spine and bony pelvis, with progression from prior exam. For example sclerotic lesion  within L5 vertebral body measures 16 mm, previously 8 mm on PET. No evidence of pathologic fracture. IMPRESSION: CT CHEST IMPRESSION: 1. Moderate bilateral pleural effusions, increased in size from prior exams. 2. Multifocal metastatic pulmonary nodules, suboptimally assessed given breathing motion artifact. Sclerotic osseous metastatic disease appears similar to prior. Increased size of left axillary lymph node. 3. New left upper lobe perihilar masslike opacity measuring approximately 4.3 x 2.0 cm may be pneumonia or progression of metastatic disease. CT ABDOMEN/PELVIS IMPRESSION: 1. Widespread hepatic metastatic disease, with likely progression from prior PET. 2. Small to moderate abdominopelvic ascites, new. 3. Probable wall thickening of the cecum and ascending colon, adjacent ascites partially obscures evaluation. This may be infectious or inflammatory. 4. Osseous metastatic disease in the lumbar spine and pelvis with mild progression from prior exam Electronically Signed   By: Jeb Levering M.D.   On: 12/10/2016 22:03      Scheduled Meds: . chlorhexidine  15 mL Mouth Rinse BID  . dronabinol  5 mg Oral BID AC  . [START ON 12/13/2016] feeding supplement  1 Container Oral Q24H  . furosemide  40 mg Intravenous Q12H  . mouth rinse  15 mL Mouth Rinse q12n4p  . multivitamin  15 mL Oral Daily  . sodium chloride flush  3 mL Intravenous Q12H  . Tbo-filgastrim (GRANIX) SQ  480 mcg Subcutaneous q1800   Continuous Infusions: . ceFEPime (MAXIPIME) IV Stopped (12/12/16 1022)  . fluconazole (DIFLUCAN) IV Stopped (12/11/16 2227)     LOS: 3 days    Time spent: 9 minutes   Domenic Polite, MD [facility] Page via [email or web address] Password [email or web address] 12/12/2016, 1:09 PM

## 2016-12-13 DIAGNOSIS — C799 Secondary malignant neoplasm of unspecified site: Secondary | ICD-10-CM

## 2016-12-13 DIAGNOSIS — R112 Nausea with vomiting, unspecified: Secondary | ICD-10-CM

## 2016-12-13 MED ORDER — GI COCKTAIL ~~LOC~~
30.0000 mL | Freq: Every day | ORAL | Status: DC
  Administered 2016-12-13: 30 mL via ORAL
  Filled 2016-12-13: qty 30

## 2016-12-13 MED ORDER — MORPHINE SULFATE (CONCENTRATE) 10 MG /0.5 ML PO SOLN: 5.0000 mg | ORAL | 0 refills | Status: AC | PRN

## 2016-12-13 MED ORDER — GI COCKTAIL ~~LOC~~: 30.0000 mL | Freq: Three times a day (TID) | ORAL | Status: AC | PRN

## 2016-12-13 MED ORDER — DRONABINOL 5 MG PO CAPS: 5.0000 mg | ORAL_CAPSULE | Freq: Two times a day (BID) | ORAL | Status: AC

## 2016-12-13 MED ORDER — FUROSEMIDE 10 MG/ML IJ SOLN: 20.0000 mg | Freq: Every day | INTRAMUSCULAR | Status: DC

## 2016-12-13 NOTE — Progress Notes (Signed)
Pharmacy Antibiotic Note  Rita Frazier is a 41 y.o. 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  Pharmacy has been consulted for cefepime and vancomycin dosing.  Today, 12/13/2016 Day #5 Cefepime Tmax/24h 99 WBC 0.6, ANC 0.1 - Granix started 9/4 SCr 0.63, stable CrCl > 100 ml/min Cultures negative to date  Plan: Continue Cefepime 2 Gm IV q8h Follow up renal function & cultures  Height: 5\' 10"  (177.8 cm) Weight: 208 lb 15.9 oz (94.8 kg) IBW/kg (Calculated) : 68.5  Temp (24hrs), Avg:98.4 F (36.9 C), Min:97.7 F (36.5 C), Max:99 F (37.2 C)   Recent Labs Lab 12/08/16 2234 12/08/16 2246 12/09/16 0102 12/09/16 0456 12/10/16 0451 12/11/16 0528 12/11/16 1136 12/12/16 0453  WBC 0.9*  --   --  0.6* 0.5* 0.5*  --  0.6*  CREATININE 0.57  --   --   --  0.51 0.44  --  0.63  LATICACIDVEN  --  2.33* 2.04* 1.7  --   --   --   --   VANCOTROUGH  --   --   --   --   --   --  17  --     Estimated Creatinine Clearance: 117.7 mL/min (by C-G formula based on SCr of 0.63 mg/dL).    Allergies  Allergen Reactions  . Hydromorphone Nausea And Vomiting    Antimicrobials this admission: 9/3 cefepime >>  9/3 fluconazole >>  9/3 vancomycin >>  Dose adjustments this admission: 9/5 VT at 11:00 = 17 mcg/ml on 1g q8h  Microbiology results: 9/3 BCx: ngtd 9/4 UCx: NG (final)  Thank you for allowing pharmacy to be a part of this patient's care.  Peggyann Juba, PharmD, BCPS Pager: [phone] 12/13/2016 9:40 AM

## 2016-12-13 NOTE — Care Management Note (Signed)
Case Management Note  Patient Details  Name: Rita Frazier MRN: [id number] Date of Birth: 19-Jan-1976  Subjective/Objective:CSW following for d/c plan-Residential Hospice Facility.                    Action/Plan:d/c Residential Hospice   Expected Discharge Date:  12/13/16               Expected Discharge Plan:  [place]  In-House Referral:  Clinical Social Work  Discharge planning Services  CM Consult  Post Acute Care Choice:    Choice offered to:     DME Arranged:    DME Agency:     HH Arranged:    [place] Agency:     Status of Service:  Completed, signed off  If discussed at [employer], dates discussed:    Additional Comments:  Dessa Phi, RN 12/13/2016, 3:36 PM

## 2016-12-13 NOTE — Progress Notes (Signed)
PMT progress note.   Patient is resting in bed, family at bedside,   We reviewed the patient's recent imaging, her hospital course thus far, and goals of care discussions were undertaken.    BP 114/69 (BP Location: Right Arm)   Pulse (!) 135   Temp 97.7 F (36.5 C) (Oral)   Resp 20   Ht 5\' 10"  (1.778 m)   Wt 94.8 kg (208 lb 15.9 oz)   SpO2 94%   BMI 29.99 kg/m  Labs and imaging noted.  Awake alert Resting in bed Mild distress Regular  no LE edema Has edema worse in RUE  Metastatic breast cancer, was recently on chemotherapy, now admitted with ongoing fatigue, nausea vomiting, dysphagia, profound neutropenia, started on Granix, anti continues to have ongoing issues with failure to thrive, severe hypoalbuminemia, very poor Po intake, functional status, third spacing with ascites, pleural effusions  Discussed with patient and her husband and other family members in detail.  We discussed about hospice philosophy of care Comfort measures were discussed in detail Home with hospice versus residential hospice options explored in detail Patient and her husband would like to proceed with residential hospice at this point CSW consult placed to help facilitate   PPS 20% Prognosis likely 2 weeks or less  35 minutes spent Loistine Chance MD [phone]  [phone]  [place] team.

## 2016-12-13 NOTE — Progress Notes (Signed)
[facility] Liaison:  RN  Received request from [place], [place], for family interest in [facility] with request for transfer today.  Chart reviewed.  Spoke to [place], spouse, to confirm interest and explain services.  Rita Frazier is agreeable to transfer today.  Elmyra Ricks, CSW, is aware.  Registration paperwork to be completed today at 330pm.  Dr. Orpah Melter to assume care per family request.  Please fax discharge summary to [phone].  RN, please call report to [phone].  Please arrange transport for patient to arrive as soon as possible.   Thank you for this referral.  Edyth Gunnels, RN, [facility] Liaison [phone]

## 2016-12-13 NOTE — Progress Notes (Addendum)
D/C summary faxed to [phone]. [facility] called for pick up. ETA: unknown "There are 20 people in front of her." CSW informed Nurse,and Amy Liaison at [facility]. CSW called Liliane Channel at [facility] and inquired if patient could be moved up on list. He plans to send next truck available in the area to pick up patient. Nurse/Amy informed.   Kathrin Greathouse, Latanya Presser, MSW Clinical Social Worker 5E and [place] [phone] 12/13/2016  3:44 PM

## 2016-12-13 NOTE — Progress Notes (Signed)
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 She states that her chemotherapy regimen was changed and since then she has been quite ill. She has had very poor oral intake, worsening nausea, vomiting, weakness, dysphagia. Patient was admitted to the hospital secondary to dehydration. Overnight on 9/3, she developed a fever, ANC dropped to 0.   Assessment & Plan:   Febrile Neutropenia - was on empiric Vanc and Cefepime since 9/3 - Blood Cx negative - no clear source of infection - Stopped vancomycin 9/5, remains on D5 cefepime -CT chest and Abd pelvis with mod bilateral effusions, Pulm mets, and lung opacity Pna vs worsening mets and Ct abd with worsening liver mets, probable wall thickening of colon but obscure due to ascites -Remains afebrile -Continue subcutaneous Granix daily due to severe neutropenia  Widely metastatic Breast CA -with mets to lungs, bones and worsening liver mets, bili now 6-7 -with ongoing failure to thrive, severe hypoalbuminemia, very poor Po intake, functional status, third spacing with ascites, pleural effusions -palliative care following -Dr.Feng reviewed case with oncologist at [facility] yesterday 9/5, plan to continue best supportive care and if there is clinical improvement to consider alternate chemotherapy however otherwise recommendation for hospice  -I had a long discussion with patient, spouse, siblings yesterday and today at bedside  -started her on Marinol 9/6  -We also discussed the fact that her prognosis appears to be very poor  with multisystem involvement of her disease and limited functional status as a result,  patient thinks that she may not be able to tolerate any further chemotherapy, we briefly discussed hospice as well -Palliative MD to follow-up today  Pancytopenia -due to aggressive Chemo -s/p Granix daily since 9/4-no change in the granulocyte count so far   Esophagitis -continue Diflucan IV   Severe protein calorie Malnutrition -Dietitian consult  Left upper extremity DVT -Holding eliquis in setting of worsening thrombocytopenia. Resume when plt improved  -SCDs for now  DVT prophylaxis: SCD Code Status: DNR Family Communication: family at bedside Disposition Plan: Likely home with hospice services   Consultants:   Oncology  Palliative care   Procedures:   None   Antimicrobials:  Anti-infectives    Start     Dose/Rate Route Frequency Ordered Stop   12/10/16 0000  fluconazole (DIFLUCAN) IVPB 100 mg  Status:  Discontinued     100 mg 50 mL/hr over 60 Minutes Intravenous Every 24 hours 12/09/16 0124 12/09/16 1928   12/09/16 2200  fluconazole (DIFLUCAN) IVPB 100 mg     100 mg 50 mL/hr over 60 Minutes Intravenous Every 24 hours 12/09/16 1928     12/09/16 1200  vancomycin (VANCOCIN) IVPB 1000 mg/200 mL premix  Status:  Discontinued     1,000 mg 200 mL/hr over 60 Minutes Intravenous Every 8 hours 12/09/16 0238 12/11/16 1115   12/09/16 0200  vancomycin (VANCOCIN) IVPB 1000 mg/200 mL premix     1,000 mg 200 mL/hr over 60 Minutes Intravenous  Once 12/09/16 0157 12/09/16 0332   12/09/16 0200  ceFEPIme (MAXIPIME) 2 g in dextrose 5 % 50 mL IVPB  2 g 100 mL/hr over 30 Minutes Intravenous Every 8 hours 12/09/16 0153     12/09/16 0130  fluconazole (DIFLUCAN) IVPB 200 mg     200 mg 100 mL/hr over 60 Minutes Intravenous  Once 12/09/16 0124 12/09/16 0334       Subjective: -By mouth intake remains very poor, GI cocktail helping a little with dysphagia, tells me that she may not be able to  tolerate any further chemotherapy  Objective: Vitals:   12/12/16 0644 12/12/16 1500 12/12/16 2230 12/13/16 0650  BP: 132/64 (!) 120/52 (!) 121/58 114/69  Pulse: (!) 127 (!) 123 (!) 122 (!) 135  Resp: 20 20 20 20   Temp: 98.3 F (36.8 C) 98.4 F (36.9 C) 99 F (37.2 C) 97.7 F (36.5 C)  TempSrc: Oral Axillary Axillary Oral  SpO2: 96% 93% 92% 94%  Weight:      Height:        Intake/Output Summary (Last 24 hours) at 12/13/16 1250 Last data filed at 12/13/16 0230  Gross per 24 hour  Intake               50 ml  Output                0 ml  Net               50 ml   Filed Weights   12/08/16 2143 12/09/16 0208  Weight: 91.6 kg (202 lb) 94.8 kg (208 lb 15.9 oz)    Examination:  Gen: Chronically ill femaleChronically ill African-American female, appears much older than stated age, uncomfortable, laying in bed HEENT: PERRLA, Neck supple, no JVD Lungs:  decreased breath sounds both bases CVS: RRR, tachycardic Abd: soft, mildly distended, fluid thrill noted, nontender, Bs present  Extremities: severe third spacing with 1-2+ edema in upper and lower extremities Skin: no new rashes Psychiatry: sad and tearful  Data Reviewed: I have personally reviewed following labs and imaging studies  CBC:  Recent Labs Lab 12/08/16 2234 12/09/16 0456 12/10/16 0451 12/11/16 0528 12/12/16 0453  WBC 0.9* 0.6* 0.5* 0.5* 0.6*  NEUTROABS 0.5*  --  0.0* 0.0* 0.1*  HGB 9.7* 8.8* 8.5* 8.5* 9.0*  HCT 28.9* 26.4* 26.4* 25.7* 27.1*  MCV 87.3 88.0 87.4 88.6 87.4  PLT 53* 59* 46* 48* 60*   Basic Metabolic Panel:  Recent Labs Lab 12/08/16 2234 12/10/16 0451 12/11/16 0528 12/12/16 0453  NA 136 137 137 141  K 3.5 3.5 3.3* 3.5  CL 102 108 109 111  CO2 23 22 21* 21*  GLUCOSE 129* 158* 124* 90  BUN 11 8 6 10   CREATININE 0.57 0.51 0.44 0.63  CALCIUM 8.6* 7.9* 8.1* 8.4*   GFR: Estimated Creatinine Clearance: 117.7 mL/min (by C-G formula based on SCr of 0.63 mg/dL). Liver Function  Tests:  Recent Labs Lab 12/08/16 2234 12/10/16 0451 12/11/16 0528 12/12/16 0453  AST 252* 204* 176* 179*  ALT 64* 54 48 47  ALKPHOS 362* 350* 336* 387*  BILITOT 7.6* 6.3* 6.2* 6.7*  PROT 7.3 6.6 6.4* 6.6  ALBUMIN 2.1* 1.9* 1.8* 1.8*    Recent Labs Lab 12/09/16 0009  LIPASE 27   No results for input(s): AMMONIA in the last 168 hours. Coagulation Profile:  Recent Labs Lab 12/08/16 2234  INR 2.71   Cardiac Enzymes: No results for input(s): CKTOTAL, CKMB, CKMBINDEX, TROPONINI in the last 168 hours. BNP (last 3 results) No results for input(s): PROBNP in the last 8760 hours. HbA1C: No results for input(s):  HGBA1C in the last 72 hours. CBG: No results for input(s): GLUCAP in the last 168 hours. Lipid Profile: No results for input(s): CHOL, HDL, LDLCALC, TRIG, CHOLHDL, LDLDIRECT in the last 72 hours. Thyroid Function Tests: No results for input(s): TSH, T4TOTAL, FREET4, T3FREE, THYROIDAB in the last 72 hours. Anemia Panel: No results for input(s): VITAMINB12, FOLATE, FERRITIN, TIBC, IRON, RETICCTPCT in the last 72 hours. Sepsis Labs:  Recent Labs Lab 12/08/16 2246 12/09/16 0102 12/09/16 0456  PROCALCITON  --   --  1.69  LATICACIDVEN 2.33* 2.04* 1.7    Recent Results (from the past 240 hour(s))  Culture, blood (Routine x 2)     Status: None (Preliminary result)   Collection Time: 12/08/16 10:34 PM  Result Value Ref Range Status   Specimen Description BLOOD RIGHT HAND  Final   Special Requests   Final    BOTTLES DRAWN AEROBIC AND ANAEROBIC Blood Culture adequate volume   Culture   Final    NO GROWTH 3 DAYS Performed at [facility], [place] [address] [place], [place] [address]    Report Status PENDING  Incomplete  Culture, blood (Routine x 2)     Status: None (Preliminary result)   Collection Time: 12/09/16 12:40 AM  Result Value Ref Range Status   Specimen Description BLOOD RIGHT ANTECUBITAL  Final   Special Requests   Final    BOTTLES DRAWN AEROBIC  AND ANAEROBIC Blood Culture adequate volume   Culture   Final    NO GROWTH 3 DAYS Performed at [facility], [place] [address], [place], [place] [address]    Report Status PENDING  Incomplete  Culture, Urine     Status: None   Collection Time: 12/10/16  1:19 PM  Result Value Ref Range Status   Specimen Description URINE, CLEAN CATCH  Final   Special Requests Immunocompromised  Final   Culture   Final    NO GROWTH Performed at [facility], [place] [address] [place], [place] [address]    Report Status 12/11/2016 FINAL  Final       Radiology Studies: No results found.    Scheduled Meds: . chlorhexidine  15 mL Mouth Rinse BID  . dronabinol  5 mg Oral BID AC  . feeding supplement  1 Container Oral Q24H  . [START ON 12/14/2016] furosemide  20 mg Intravenous Daily  . gi cocktail  30 mL Oral 6 X Daily  . mouth rinse  15 mL Mouth Rinse q12n4p  . multivitamin  15 mL Oral Daily  . pantoprazole (PROTONIX) IV  40 mg Intravenous Daily  . sodium chloride flush  3 mL Intravenous Q12H  . Tbo-filgastrim (GRANIX) SQ  480 mcg Subcutaneous q1800   Continuous Infusions: . ceFEPime (MAXIPIME) IV 2 g (12/13/16 1118)  . fluconazole (DIFLUCAN) IV Stopped (12/13/16 0021)     LOS: 4 days    Time spent: 27 minutes   Domenic Polite, MD [facility] Page via [email or web address] Password [email or web address] 12/13/2016, 12:50 PM

## 2016-12-13 NOTE — Clinical Social Work Note (Signed)
Clinical Social Work Assessment  Patient Details  Name: Rita Frazier MRN: [id number] Date of Birth: 07/08/1975  Date of referral:  12/13/16               Reason for consult:  End of Life/Hospice                Permission sought to share information with:  Family Supports Permission granted to share information::     Name::        Agency::     Relationship::  Daughters/Spouse  Contact Information:     Housing/Transportation Living arrangements for the past 2 months:  Single Family Home Source of Information:  Adult Children, Spouse Patient Interpreter Needed:  None Criminal Activity/Legal Involvement Pertinent to Current Situation/Hospitalization:  No - Comment as needed Significant Relationships:  Adult Children, Spouse Lives with:  Spouse Do you feel safe going back to the place where you live?    Need for family participation in patient care:  Yes   Care giving concerns: Residential Hospice.   Social Worker assessment / plan: CSW met family at bedside, explain role and reason for visit to assist with residential hospice placement. Patient and family agreeable to [facility]. CSW educated family about process.  CSW called [facility], at this time facility has bed and will accept patient. [facility] will meet with family at 3:30pm to complete admissions paperwork and answer further questions.  Discharge to [facility], [facility]. Form complete. Physician notified to complete D/C summary.   Plan: [facility].   Employment status:    Insurance information:  Managed Medicare PT Recommendations:  Not assessed at this time Information / Referral to community resources:     Patient/Family's Response to care:  Agreeable to transition to [facility], comfort Care.   Patient/Family's Understanding of and Emotional Response to Diagnosis, Current Treatment, and Prognosis:  " We want her at [facility] close to family."  Emotional Assessment Appearance:  Appears stated  age Attitude/Demeanor/Rapport:    Affect (typically observed):  Calm Orientation:  Oriented to Self, Oriented to Place, Oriented to Situation Alcohol / Substance use:    Psych involvement (Current and /or in the community):  No (Comment)  Discharge Needs  Concerns to be addressed:    Readmission within the last 30 days:  No Current discharge risk:  None Barriers to Discharge:  No Barriers Identified   Nicole A Sinclair, LCSW 12/13/2016, 2:29 PM  

## 2016-12-13 NOTE — Discharge Summary (Addendum)
Physician Discharge Summary  Rita Frazier OFB:[id number] DOB: 1976-03-01 DOA: 12/08/2016  PCP: Patient, No Pcp Per  Admit date: 12/08/2016 Discharge date: 12/13/2016  Time spent: 35 minutes  Recommendations for Outpatient Follow-up:  [facility] for End of life care  Discharge Diagnoses:    Widely metastatic breast CA   Sepsis ([place])   Primary breast cancer with metastasis to other site [facility])   Acute esophagitis   Diabetes ([place])   Severe protein-calorie malnutrition ([place])   Deep vein thrombosis (DVT) of left upper extremity ([place])   Chemotherapy-induced neutropenia (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 She states that her chemotherapy regimen was changed and since then she has been quite ill. She has had very poor oral intake, worsening nausea, vomiting, weakness, dysphagia  Hospital Course:   Widely metastatic Breast CA -with mets to lungs, bones and worsening liver mets, bilirubin now 6-7 range -with ongoing failure to thrive, severe hypoalbuminemia, very poor Po intake, functional status, third spacing with ascites, pleural effusions -palliative care consulted -Dr.Feng reviewed case with oncologist at [facility] regional on 9/5, plan to continue best supportive care and if there is clinical improvement to consider alternate chemotherapy however otherwise recommendation  for hospice  -pt continued to decline without any considerable improvement, she was started on Marinol, lasix, Granix daily, GI cocktail  -Finally this afternoon decision made by family for comfort care/residential Hospice   Sepsis/Febrile Neutropenia - was on empiric Vanc and Cefepime since 9/3 - Blood Cx negative - no clear source of infection - Stopped vancomycin 9/5, remains on D5 cefepime, this will be stopped today -CT chest and Abd pelvis with mod bilateral effusions, Pulm mets, and lung opacity Pna vs worsening mets and Ct abd with worsening liver mets, probable wall thickening of colon but obscure due to ascites -Remains afebrile -Continued on subcutaneous Granix daily due to severe neutropenia  Pancytopenia -due to aggressive Chemo -s/p Granix daily since 9/4-no change in the granulocyte count so far   Esophagitis -treated with Diflucan IV, PPI and magic mouthwash-no improvement  Severe protein calorie Malnutrition -Dietitian consulted, marinol added  Left upper extremity DVT -Holding eliquis in setting of worsening thrombocytopenia  Consultations:  Oncology  Palliative medicine  Discharge Exam: Vitals:   12/12/16 2230 12/13/16 0650  BP: (!) 121/58 114/69  Pulse: (!) 122 (!) 135  Resp: 20 20  Temp: 99 F (37.2 C) 97.7 F (36.5 C)  SpO2: 92% 94%    General: debilitated, cachectic Cardiovascular: S1S2/tachycardic Respiratory: decreased BS at bases  Discharge Instructions   Discharge Instructions    Discharge instructions    Complete by:  As directed    Comfort feeds     Current Discharge Medication List    START taking these medications   Details  Alum & Mag Hydroxide-Simeth (GI COCKTAIL) SUSP suspension Take 30 mLs by mouth 3 (three) times daily as needed for  indigestion. Shake well.    dronabinol (MARINOL) 5 MG capsule Take 1 capsule (5 mg total) by mouth 2 (two) times daily before lunch and supper.    Morphine Sulfate (MORPHINE  CONCENTRATE) 10 mg / 0.5 ml concentrated solution Take 0.25 mLs (5 mg total) by mouth every 2 (two) hours as needed for moderate pain, severe pain or shortness of breath. Qty: 15 mL, Refills: 0      STOP taking these medications     ELIQUIS 5 MG TABS tablet      guaiFENesin-dextromethorphan (ROBITUSSIN DM) 100-10 MG/5ML syrup      ondansetron (ZOFRAN-ODT) 8 MG disintegrating tablet      prochlorperazine (COMPAZINE) 10 MG tablet      zolpidem (AMBIEN) 5 MG tablet        Allergies  Allergen Reactions  . Hydromorphone Nausea And Vomiting      The results of significant diagnostics from this hospitalization (including imaging, microbiology, ancillary and laboratory) are listed below for reference.    Significant Diagnostic Studies: Dg Chest 2 View  Result Date: 12/08/2016 CLINICAL DATA:  Stage IV breast cancer with last chemotherapy treatment 12/04/2016. Vomiting. EXAM: CHEST  2 VIEW COMPARISON:  CXR from 11/02/2016, chest CT 11/02/2016 FINDINGS: Low lung volumes with slight increase in moderate left effusion and stable small right effusion. Mild pulmonary vascular engorgement is seen with fluid in the left major fissure. Scattered pulmonary nodules are noted of the aerated lungs consistent metastatic disease. 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 Heart and mediastinal contours are stable. IMPRESSION: 1. Slightly larger left moderate pleural effusion with fluid extending into the major fissure since prior comparison studies. Stable small right pleural effusion. Mild pulmonary vascular congestion. 2. Pulmonary and osseous metastatic disease again noted, chronic in appearance. Electronically Signed   By: Ashley Royalty M.D.   On: 12/08/2016 22:43   Ct Chest W Contrast  Result Date: 12/10/2016 CLINICAL DATA:  Pleural effusion. Nausea and vomiting. Metastatic breast cancer. EXAM: CT CHEST, ABDOMEN, AND PELVIS WITH CONTRAST TECHNIQUE: Multidetector CT imaging of the chest,  abdomen and pelvis was performed following the standard protocol during bolus administration of intravenous contrast. CONTRAST:  100 cc Isovue-300 IV COMPARISON:  PET-CT 09/24/2016.  Chest CTA 11/02/2016 FINDINGS: CT CHEST FINDINGS Cardiovascular: Thoracic aorta is normal in caliber. The heart is normal in size. No pericardial effusion. Mediastinum/Nodes: Left upper lobe perihilar opacity spans 4.3 x 2.0 cm, abutting the hilum. No definite hilar adenopathy. No definite right hilar adenopathy. No enlarged mediastinal nodes. Prominent left axillary node measures 12 mm short axis, increased Left supraclavicular adenopathy on PET is not well-defined on the current exam. Heterogeneous enlarged right lobe of the thyroid gland again seen. The esophagus is decompressed. Lungs/Pleura: Moderate right pleural effusion, with increased from prior CT. Progressive left pleural effusion, now moderate in size. There is adjacent atelectasis in the dependent lungs. Multiple pulmonary nodules consistent with metastatic disease are again seen, not as well assessed given breathing motion artifact. Apical septal thickening is again seen. New left upper lobe perihilar masslike opacity measures approximately 4.3 x 2.0 cm with internal air bronchograms. Trachea mainstem bronchi are grossly patent. Musculoskeletal: Sclerotic lesion in the right humeral head on prior exam is not as well seen currently. Multifocal sclerotic lesions throughout the thoracic spine. No evidence pathologic fracture. CT ABDOMEN PELVIS FINDINGS Hepatobiliary: Innumerable hepatic lesions consistent with metastatic disease. Question of progression from prior PET allowing for differences in technique. Gallbladder partially distended. No evidence of biliary obstruction.  Pancreas: No ductal dilatation or inflammation. Spleen: No focal splenic lesion. Adrenals/Urinary Tract: Bilateral adrenal thickening without dominant mass. No hydronephrosis. Ureters are decompressed.  Urinary bladder is physiologically distended. Stomach/Bowel: No bowel obstruction, enteric contrast has reached the ascending colon. The stomach is nondistended. Possible wall thickening of the cecum and ascending colon, adjacent ascites limits evaluation. Small colonic stool burden. Vascular/Lymphatic: Adenopathy in the porta hepatis disc ala to separate from the adjacent liver parenchyma. Multifocal retroperitoneal nodes, which may have decreased from prior PET normal caliber abdominal aorta. Reproductive: Enlarged uterus with multiple fibroids. Prominent endometrium which was seen on prior PET. Left ovaries normal in size. Right ovary not well seen. Other: Small to moderate volume of ascites in the abdomen and pelvis. Confluent edema in the subcutaneous flanks. No free air. Musculoskeletal: Sclerotic osseous metastatic disease at the lumbar spine and bony pelvis, with progression from prior exam. For example sclerotic lesion within L5 vertebral body measures 16 mm, previously 8 mm on PET. No evidence of pathologic fracture. IMPRESSION: CT CHEST IMPRESSION: 1. Moderate bilateral pleural effusions, increased in size from prior exams. 2. Multifocal metastatic pulmonary nodules, suboptimally assessed given breathing motion artifact. Sclerotic osseous metastatic disease appears similar to prior. Increased size of left axillary lymph node. 3. New left upper lobe perihilar masslike opacity measuring approximately 4.3 x 2.0 cm may be pneumonia or progression of metastatic disease. CT ABDOMEN/PELVIS IMPRESSION: 1. Widespread hepatic metastatic disease, with likely progression from prior PET. 2. Small to moderate abdominopelvic ascites, new. 3. Probable wall thickening of the cecum and ascending colon, adjacent ascites partially obscures evaluation. This may be infectious or inflammatory. 4. Osseous metastatic disease in the lumbar spine and pelvis with mild progression from prior exam Electronically Signed   By: Jeb Levering M.D.   On: 12/10/2016 22:03   Ct Abdomen Pelvis W Contrast  Result Date: 12/10/2016 CLINICAL DATA:  Pleural effusion. Nausea and vomiting. Metastatic breast cancer. EXAM: CT CHEST, ABDOMEN, AND PELVIS WITH CONTRAST TECHNIQUE: Multidetector CT imaging of the chest, abdomen and pelvis was performed following the standard protocol during bolus administration of intravenous contrast. CONTRAST:  100 cc Isovue-300 IV COMPARISON:  PET-CT 09/24/2016.  Chest CTA 11/02/2016 FINDINGS: CT CHEST FINDINGS Cardiovascular: Thoracic aorta is normal in caliber. The heart is normal in size. No pericardial effusion. Mediastinum/Nodes: Left upper lobe perihilar opacity spans 4.3 x 2.0 cm, abutting the hilum. No definite hilar adenopathy. No definite right hilar adenopathy. No enlarged mediastinal nodes. Prominent left axillary node measures 12 mm short axis, increased Left supraclavicular adenopathy on PET is not well-defined on the current exam. Heterogeneous enlarged right lobe of the thyroid gland again seen. The esophagus is decompressed. Lungs/Pleura: Moderate right pleural effusion, with increased from prior CT. Progressive left pleural effusion, now moderate in size. There is adjacent atelectasis in the dependent lungs. Multiple pulmonary nodules consistent with metastatic disease are again seen, not as well assessed given breathing motion artifact. Apical septal thickening is again seen. New left upper lobe perihilar masslike opacity measures approximately 4.3 x 2.0 cm with internal air bronchograms. Trachea mainstem bronchi are grossly patent. Musculoskeletal: Sclerotic lesion in the right humeral head on prior exam is not as well seen currently. Multifocal sclerotic lesions throughout the thoracic spine. No evidence pathologic fracture. CT ABDOMEN PELVIS FINDINGS Hepatobiliary: Innumerable hepatic lesions consistent with metastatic disease. Question of progression from prior PET allowing for differences in  technique. Gallbladder partially distended. No evidence of biliary obstruction. Pancreas: No ductal dilatation or inflammation. Spleen: No  focal splenic lesion. Adrenals/Urinary Tract: Bilateral adrenal thickening without dominant mass. No hydronephrosis. Ureters are decompressed. Urinary bladder is physiologically distended. Stomach/Bowel: No bowel obstruction, enteric contrast has reached the ascending colon. The stomach is nondistended. Possible wall thickening of the cecum and ascending colon, adjacent ascites limits evaluation. Small colonic stool burden. Vascular/Lymphatic: Adenopathy in the porta hepatis disc ala to separate from the adjacent liver parenchyma. Multifocal retroperitoneal nodes, which may have decreased from prior PET normal caliber abdominal aorta. Reproductive: Enlarged uterus with multiple fibroids. Prominent endometrium which was seen on prior PET. Left ovaries normal in size. Right ovary not well seen. Other: Small to moderate volume of ascites in the abdomen and pelvis. Confluent edema in the subcutaneous flanks. No free air. Musculoskeletal: Sclerotic osseous metastatic disease at the lumbar spine and bony pelvis, with progression from prior exam. For example sclerotic lesion within L5 vertebral body measures 16 mm, previously 8 mm on PET. No evidence of pathologic fracture. IMPRESSION: CT CHEST IMPRESSION: 1. Moderate bilateral pleural effusions, increased in size from prior exams. 2. Multifocal metastatic pulmonary nodules, suboptimally assessed given breathing motion artifact. Sclerotic osseous metastatic disease appears similar to prior. Increased size of left axillary lymph node. 3. New left upper lobe perihilar masslike opacity measuring approximately 4.3 x 2.0 cm may be pneumonia or progression of metastatic disease. CT ABDOMEN/PELVIS IMPRESSION: 1. Widespread hepatic metastatic disease, with likely progression from prior PET. 2. Small to moderate abdominopelvic ascites, new. 3.  Probable wall thickening of the cecum and ascending colon, adjacent ascites partially obscures evaluation. This may be infectious or inflammatory. 4. Osseous metastatic disease in the lumbar spine and pelvis with mild progression from prior exam Electronically Signed   By: Jeb Levering M.D.   On: 12/10/2016 22:03    Microbiology: Recent Results (from the past 240 hour(s))  Culture, blood (Routine x 2)     Status: None (Preliminary result)   Collection Time: 12/08/16 10:34 PM  Result Value Ref Range Status   Specimen Description BLOOD RIGHT HAND  Final   Special Requests   Final    BOTTLES DRAWN AEROBIC AND ANAEROBIC Blood Culture adequate volume   Culture   Final    NO GROWTH 3 DAYS Performed at [facility], [place] [address], [place], [place] [address]    Report Status PENDING  Incomplete  Culture, blood (Routine x 2)     Status: None (Preliminary result)   Collection Time: 12/09/16 12:40 AM  Result Value Ref Range Status   Specimen Description BLOOD RIGHT ANTECUBITAL  Final   Special Requests   Final    BOTTLES DRAWN AEROBIC AND ANAEROBIC Blood Culture adequate volume   Culture   Final    NO GROWTH 3 DAYS Performed at [facility], [place] [address], [place], [place] [address]    Report Status PENDING  Incomplete  Culture, Urine     Status: None   Collection Time: 12/10/16  1:19 PM  Result Value Ref Range Status   Specimen Description URINE, CLEAN CATCH  Final   Special Requests Immunocompromised  Final   Culture   Final    NO GROWTH Performed at [facility], [place] [address], [place], [place] [address]    Report Status 12/11/2016 FINAL  Final     Labs: Basic Metabolic Panel:  Recent Labs Lab 12/08/16 2234 12/10/16 0451 12/11/16 0528 12/12/16 0453  NA 136 137 137 141  K 3.5 3.5 3.3* 3.5  CL 102 108 109 111  CO2 23 22 21* 21*  GLUCOSE 129* 158* 124* 90  BUN 11 8 6 10   CREATININE 0.57 0.51 0.44 0.63  CALCIUM 8.6* 7.9* 8.1* 8.4*   Liver Function  Tests:  Recent Labs Lab 12/08/16 2234 12/10/16 0451 12/11/16 0528 12/12/16 0453  AST 252* 204* 176* 179*  ALT 64* 54 48 47  ALKPHOS 362* 350* 336* 387*  BILITOT 7.6* 6.3* 6.2* 6.7*  PROT 7.3 6.6 6.4* 6.6  ALBUMIN 2.1* 1.9* 1.8* 1.8*    Recent Labs Lab 12/09/16 0009  LIPASE 27   No results for input(s): AMMONIA in the last 168 hours. CBC:  Recent Labs Lab 12/08/16 2234 12/09/16 0456 12/10/16 0451 12/11/16 0528 12/12/16 0453  WBC 0.9* 0.6* 0.5* 0.5* 0.6*  NEUTROABS 0.5*  --  0.0* 0.0* 0.1*  HGB 9.7* 8.8* 8.5* 8.5* 9.0*  HCT 28.9* 26.4* 26.4* 25.7* 27.1*  MCV 87.3 88.0 87.4 88.6 87.4  PLT 53* 59* 46* 48* 60*   Cardiac Enzymes: No results for input(s): CKTOTAL, CKMB, CKMBINDEX, TROPONINI in the last 168 hours. BNP: BNP (last 3 results)  Recent Labs  11/04/16 0502  BNP 33.8    ProBNP (last 3 results) No results for input(s): PROBNP in the last 8760 hours.  CBG: No results for input(s): GLUCAP in the last 168 hours.     SignedDomenic Polite MD.  [facility] 12/13/2016, 3:18 PM

## 2016-12-14 LAB — CULTURE, BLOOD (ROUTINE X 2)
CULTURE: NO GROWTH
Culture: NO GROWTH
Special Requests: ADEQUATE
Special Requests: ADEQUATE

## 2017-01-06 DEATH — deceased

## 2019-03-19 IMAGING — CT CT EXTREM UP ENTIRE ARM*L* WO/W CM
2 of 3 series · 10 of 35 positions shown, 12 images · IV contrast (agent unspecified)
Comparison: None.

CONTRAST:  100mL VOXSDR-PCC IOPAMIDOL (VOXSDR-PCC) INJECTION 61%

CLINICAL DATA: Left upper extremity DVT with ongoing swelling and
pain, concern for abscess. History of metastatic breast cancer.

EXAM:
CT OF THE UPPER LEFT EXTREMITY WITHOUT AND WITH CONTRAST
TECHNIQUE: Multidetector CT imaging of the upper left extremity was performed
following the standard protocol before and during bolus
administration of intravenous contrast.

[Series 8: ax st · axial · 0.38mm/px · z∈[-707,-61]mm · 7 of 527 slices shown, 9 images]
[im 41/527  soft-tissue]
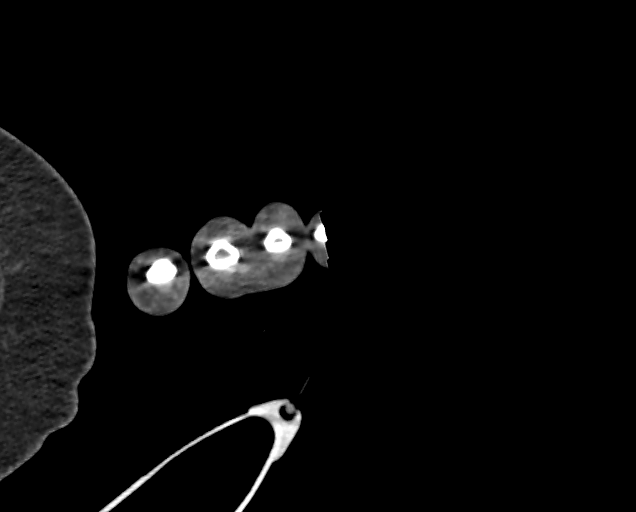
[im 41/527  bone]
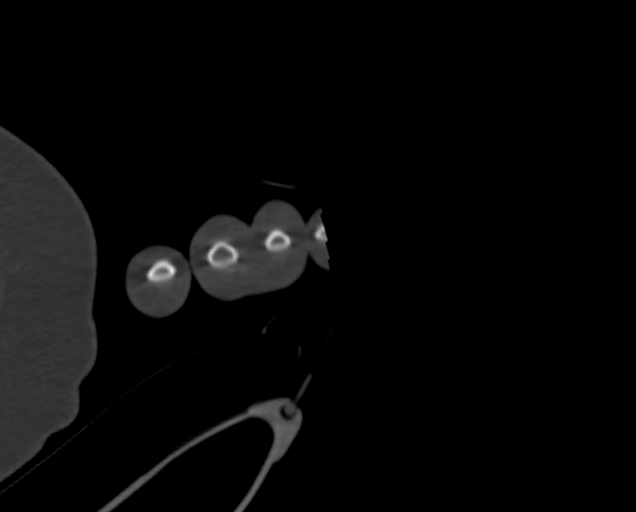
[im 122/527  bone]
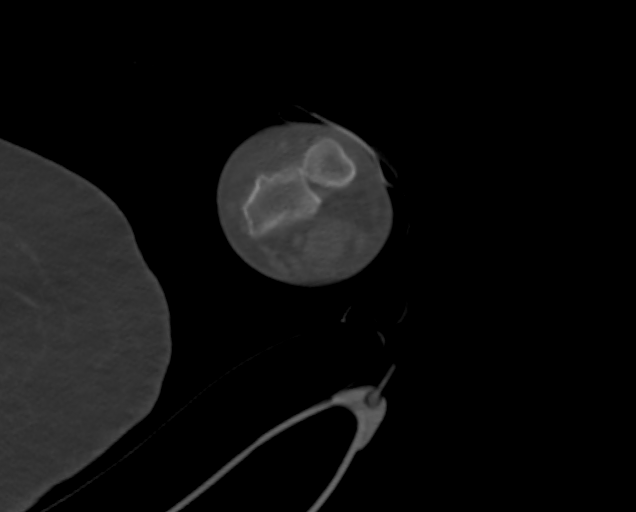
[im 203/527  bone]
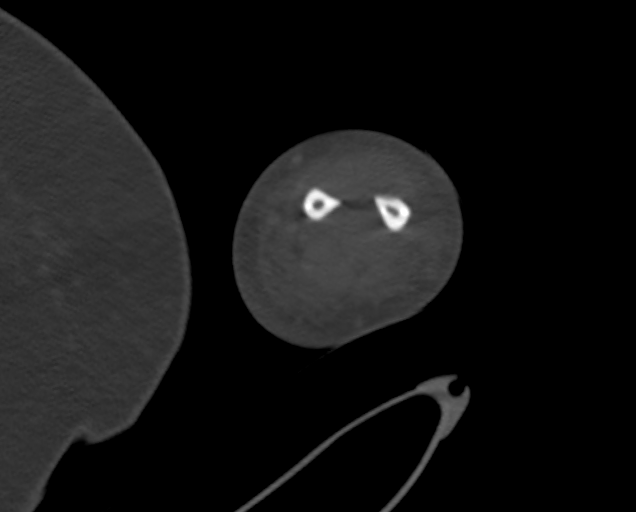
[im 284/527  bone]
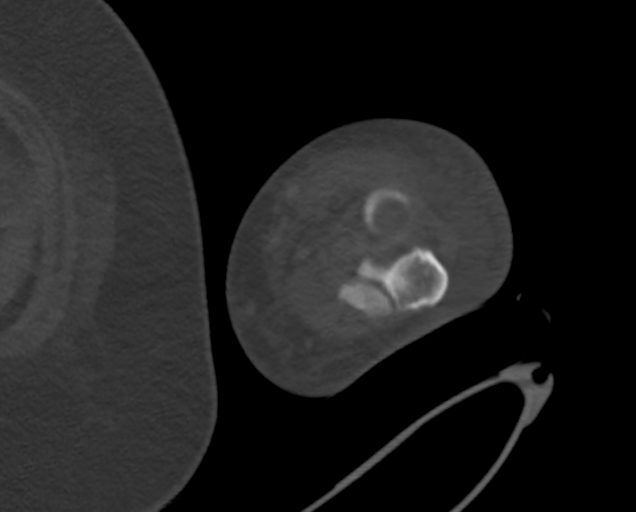
[im 324/527  soft-tissue]
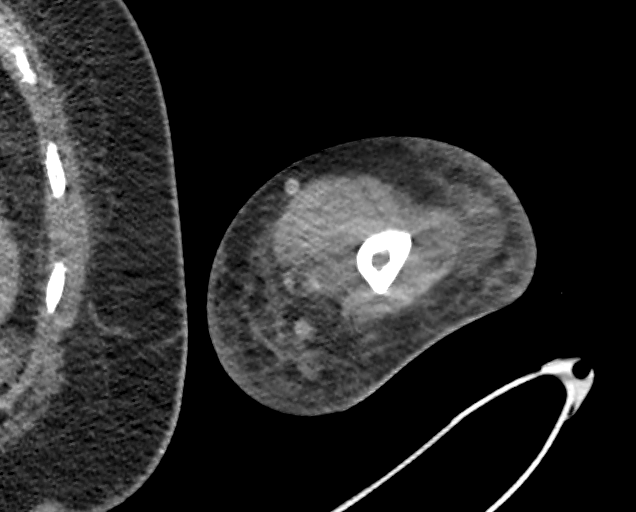
[im 324/527  bone]
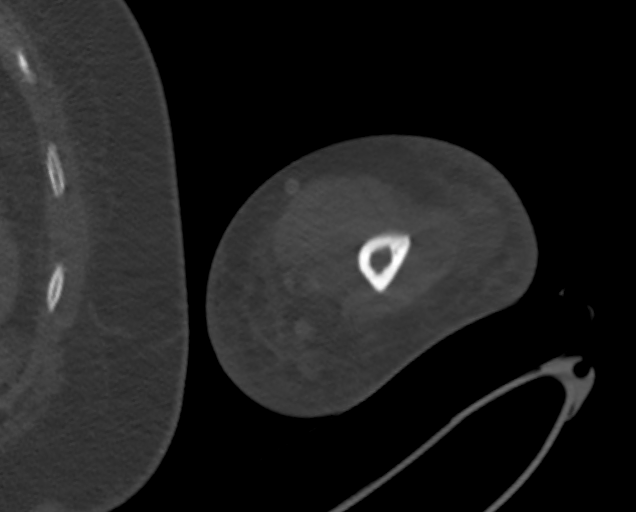
[im 405/527  bone]
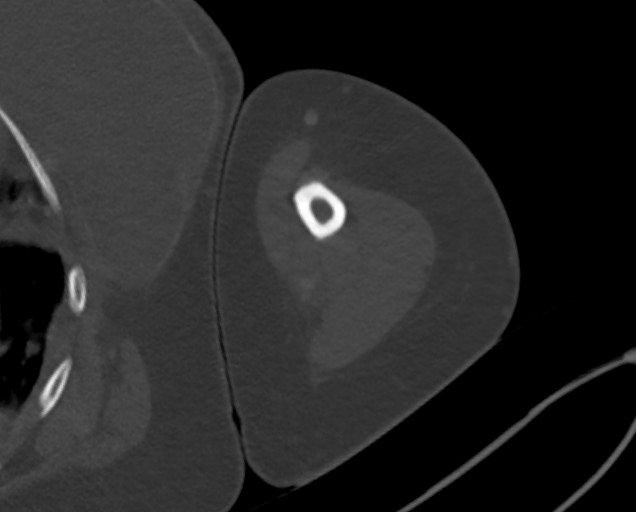
[im 486/527  bone]
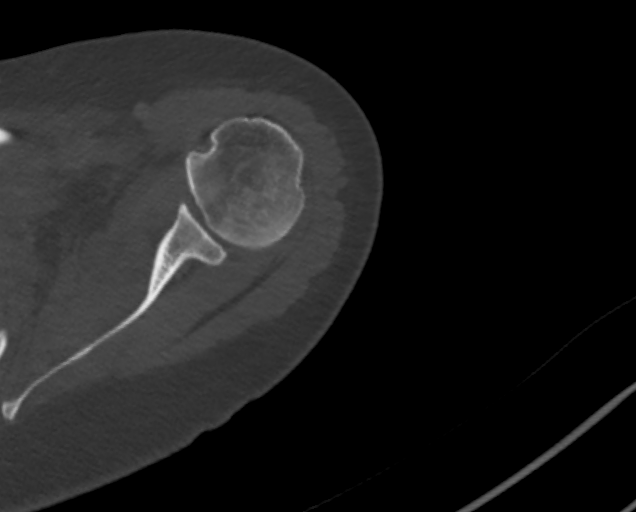

[Series 9: cor st · coronal · 0.56mm/px · 3 of 134 slices shown]
[im 27/134  bone]
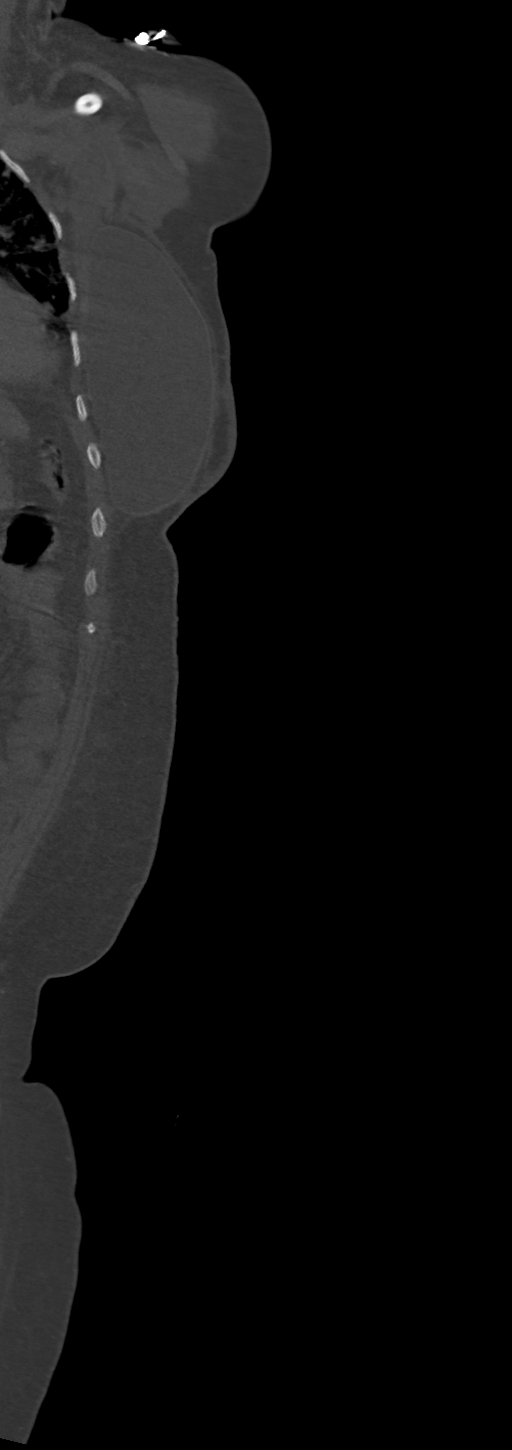
[im 54/134  bone]
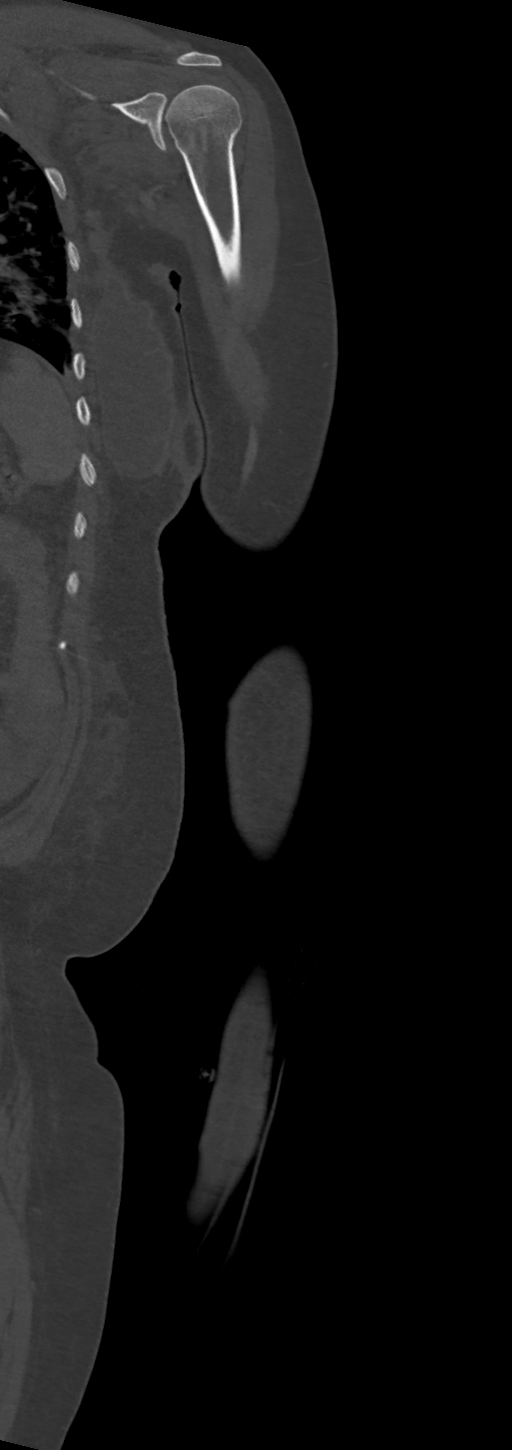
[im 80/134  bone]
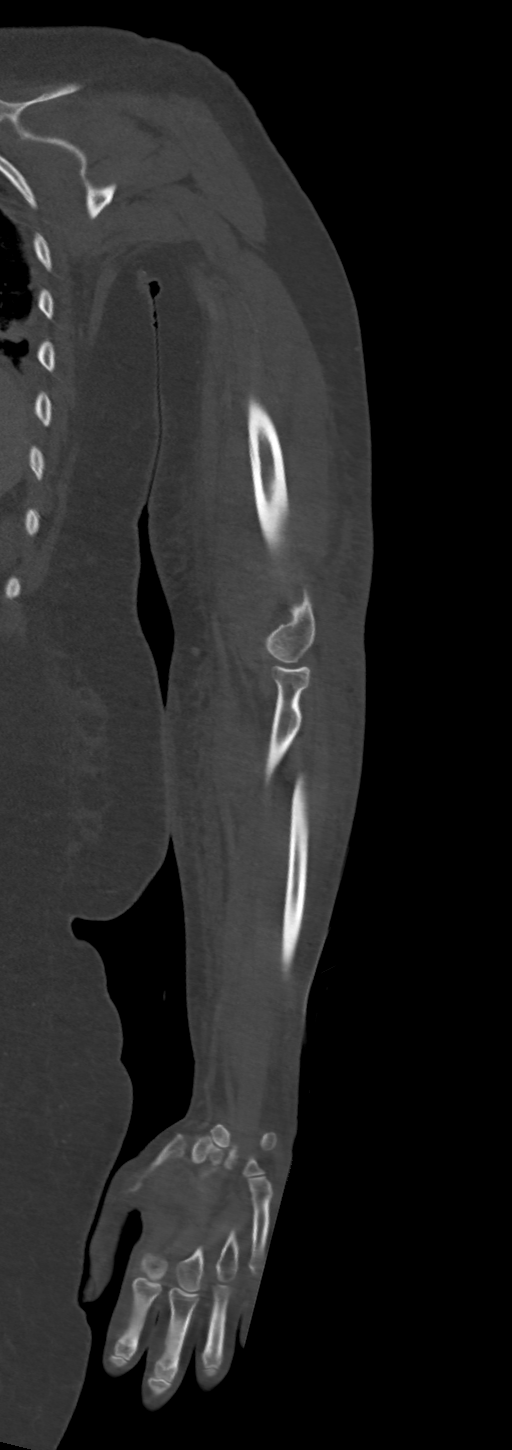

[10 of 35 positions shown; findings below may reference images not displayed]

FINDINGS: Bones/Joint/Cartilage

No acute osseous abnormality. No fracture dislocation. No discrete
lytic or sclerotic lesions identified. The shoulder and elbow are
grossly unremarkable.

Ligaments

Suboptimally assessed by CT.

Soft tissues

There is diffuse subcutaneous edema from the mid upper arm extending
into the hand. There is no drainable fluid collection.

Normal muscle bulk.

Poor opacification of the left subclavian, axillary, and brachial
veins, better evaluated on recent left arm venous ultrasound.

Surgical clips again noted in the left axilla. Left mastectomy with
implant reconstruction. Partially visualized small left pleural
effusion. Innumerable pulmonary nodules within the left lung,
consistent with metastases.
IMPRESSION: 1. Diffuse subcutaneous edema from the left mid upper arm extending
into the hand. No drainable fluid collection.
2. Poor opacification of the left subclavian, axillary, in
particular in the hands, better evaluated on recent left upper
extremity venous ultrasound.
3. Partially visualized small left pleural effusion.
4. Partially visualized left lung nodules, consistent with
metastases.

## 2019-04-21 IMAGING — CR DG CHEST 2V
2 series · 2 of 2 positions shown · non-contrast
Comparison: CXR from 11/02/2016, chest CT 11/02/2016

CLINICAL DATA: Stage IV breast cancer with last chemotherapy
treatment 12/04/2016. Vomiting.

EXAM:
CHEST  2 VIEW

[w chest lat]
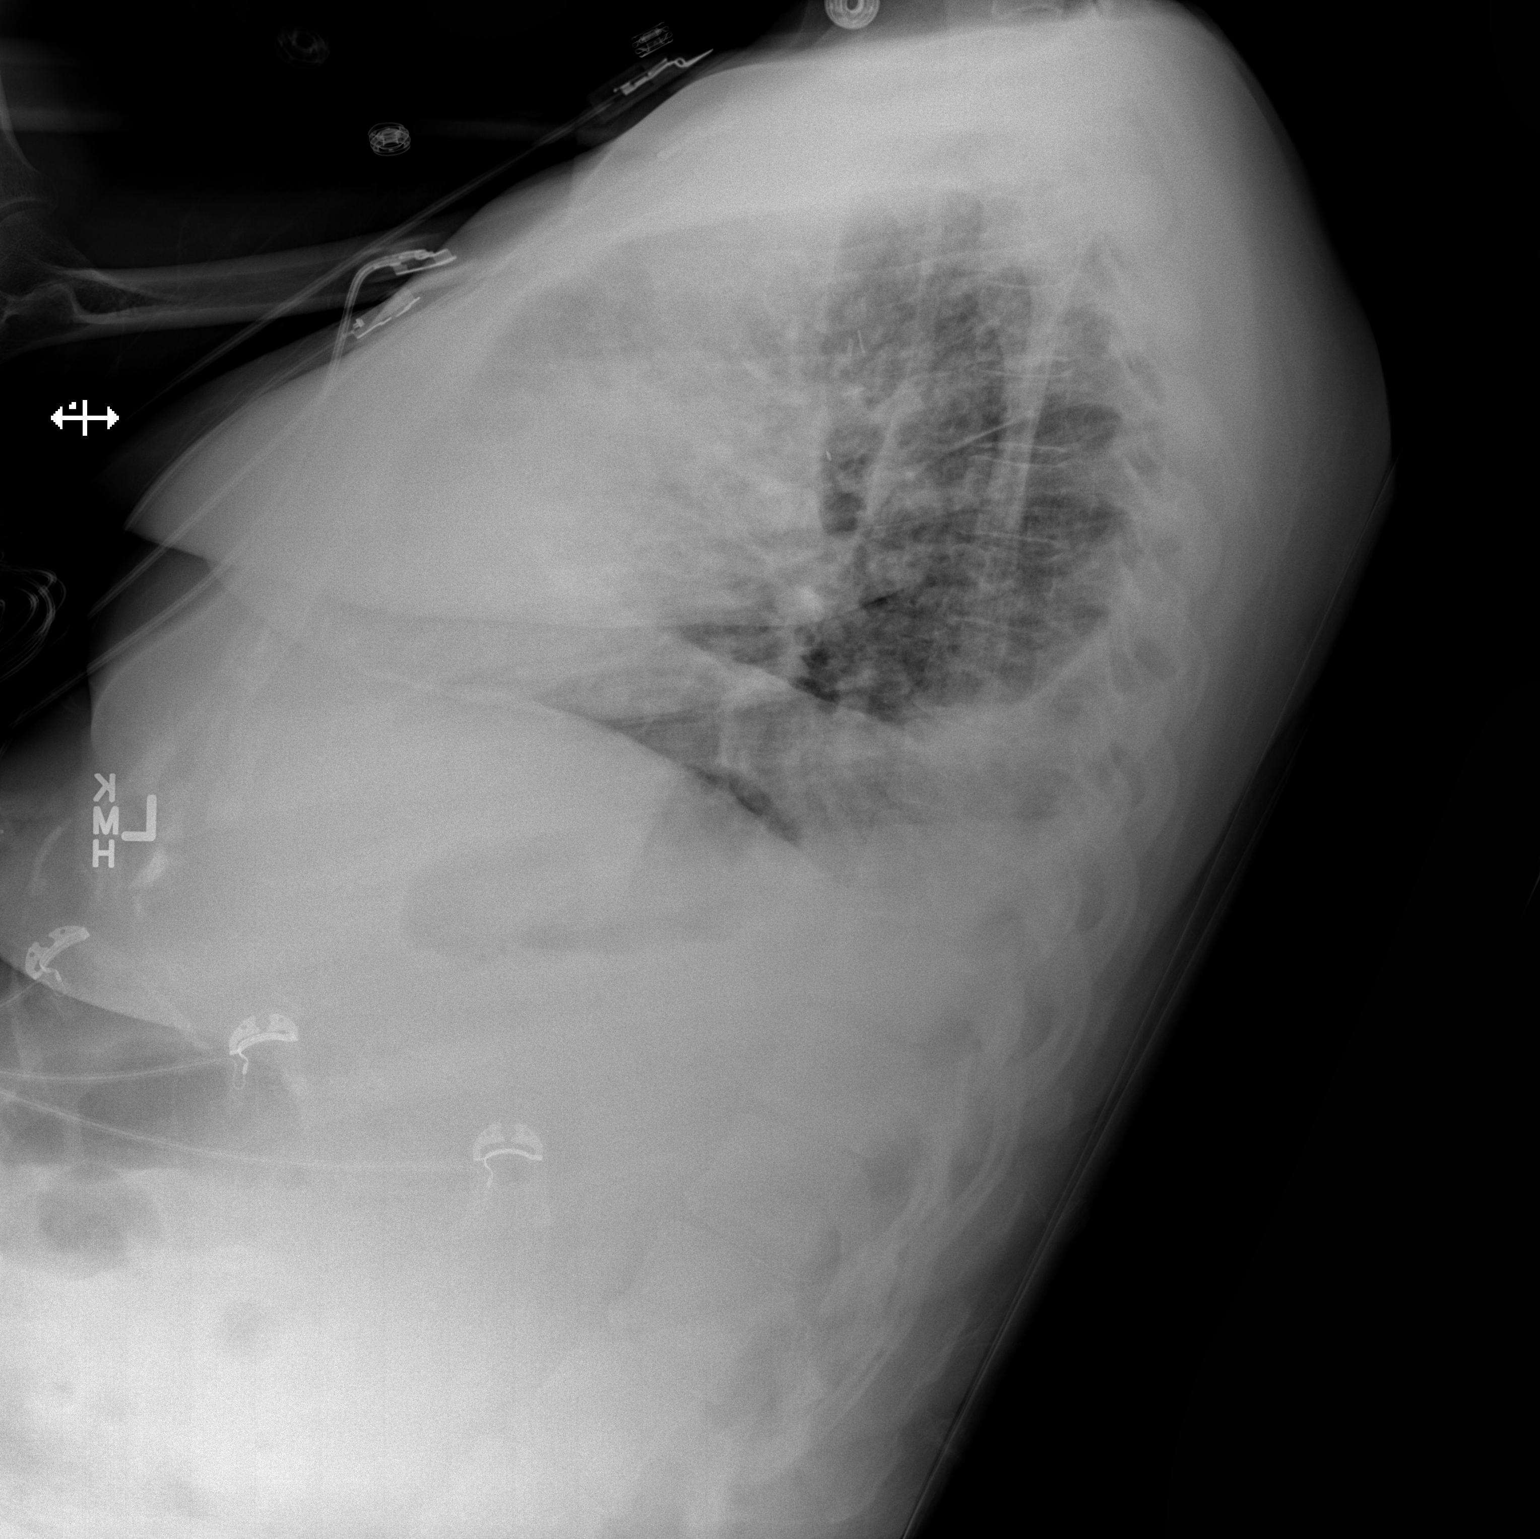

[x chest ap]
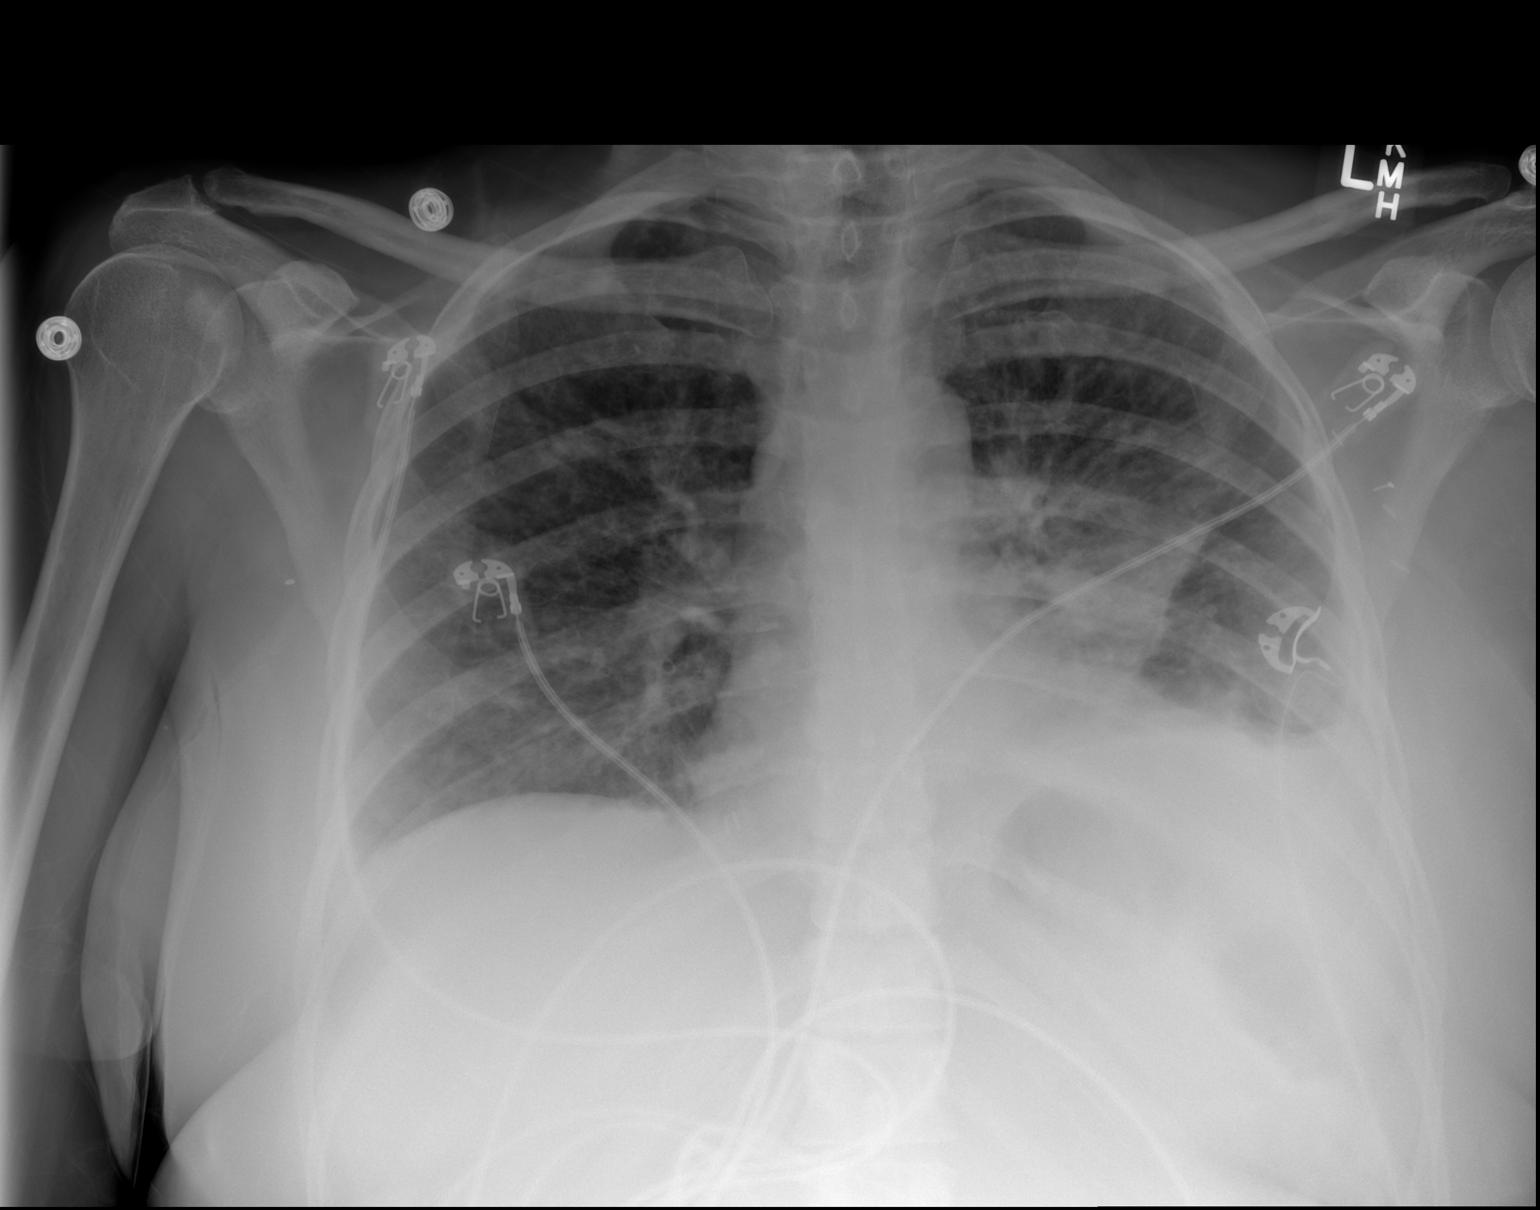

[2 of 2 positions shown; findings below may reference images not displayed]

FINDINGS: Low lung volumes with slight increase in moderate left effusion and
stable small right effusion. Mild pulmonary vascular engorgement is
seen with fluid in the left major fissure. Scattered pulmonary
nodules are noted of the aerated lungs consistent metastatic
disease. Stable osteoblastic disease of the right humeral head.
Heart and mediastinal contours are stable.
IMPRESSION: 1. Slightly larger left moderate pleural effusion with fluid
extending into the major fissure since prior comparison studies.
Stable small right pleural effusion. Mild pulmonary vascular
congestion.
2. Pulmonary and osseous metastatic disease again noted, chronic in
appearance.
